# Patient Record
Sex: Female | Born: 1995 | Race: White | Hispanic: No | Marital: Single | Smoking: Former smoker
Health system: Southern US, Community
[De-identification: ages and names within clinical notes are randomized; demographics above are authoritative.]

## PROBLEM LIST (undated history)

## (undated) ENCOUNTER — Inpatient Hospital Stay (HOSPITAL_COMMUNITY): Payer: Self-pay

## (undated) DIAGNOSIS — J45909 Unspecified asthma, uncomplicated: Secondary | ICD-10-CM

## (undated) DIAGNOSIS — N200 Calculus of kidney: Secondary | ICD-10-CM

## (undated) HISTORY — PX: TONSILLECTOMY: SUR1361

## (undated) HISTORY — DX: Unspecified asthma, uncomplicated: J45.909

---
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
## 2016-01-01 ENCOUNTER — Encounter: Payer: Self-pay | Admitting: Obstetrics & Gynecology

## 2016-01-01 ENCOUNTER — Ambulatory Visit (INDEPENDENT_AMBULATORY_CARE_PROVIDER_SITE_OTHER): Payer: Medicaid Other | Admitting: Obstetrics & Gynecology

## 2016-01-01 VITALS — BP 98/60 | HR 74 | Wt 120.0 lb

## 2016-01-01 DIAGNOSIS — Z331 Pregnant state, incidental: Secondary | ICD-10-CM

## 2016-01-01 DIAGNOSIS — Z3402 Encounter for supervision of normal first pregnancy, second trimester: Secondary | ICD-10-CM

## 2016-01-01 DIAGNOSIS — Z1389 Encounter for screening for other disorder: Secondary | ICD-10-CM

## 2016-01-01 LAB — POCT URINALYSIS DIPSTICK
Glucose, UA: NEGATIVE
Ketones, UA: NEGATIVE
Leukocytes, UA: NEGATIVE
NITRITE UA: NEGATIVE
Protein, UA: NEGATIVE
RBC UA: NEGATIVE

## 2016-01-01 NOTE — Progress Notes (Signed)
G1P0 5234w2d Estimated Date of Delivery: 04/06/16  Blood pressure 98/60, pulse 74, weight 120 lb (54.4 kg), last menstrual period 07/01/2015.   BP weight and urine results all reviewed and noted.  Please refer to the obstetrical flow sheet for the fundal height and fetal heart rate documentation:  Patient reports good fetal movement, denies any bleeding and no rupture of membranes symptoms or regular contractions. Patient is without complaints. All questions were answered.  Orders Placed This Encounter  Procedures  . POCT urinalysis dipstick    Plan:  Continued routine obstetrical care, wet prep was negative, no yeast BV Normal wet prep  Return for keep appt next week for PN2.

## 2016-01-07 ENCOUNTER — Telehealth: Payer: Self-pay | Admitting: *Deleted

## 2016-01-07 ENCOUNTER — Encounter (HOSPITAL_COMMUNITY): Payer: Self-pay | Admitting: *Deleted

## 2016-01-07 ENCOUNTER — Inpatient Hospital Stay (HOSPITAL_COMMUNITY)
Admission: AD | Admit: 2016-01-07 | Discharge: 2016-01-07 | Disposition: A | Discharge status: Home / Self Care | Payer: Medicaid Other | Source: Ambulatory Visit | Attending: Family Medicine | Admitting: Family Medicine

## 2016-01-07 DIAGNOSIS — O4702 False labor before 37 completed weeks of gestation, second trimester: Secondary | ICD-10-CM | POA: Diagnosis not present

## 2016-01-07 DIAGNOSIS — Z87891 Personal history of nicotine dependence: Secondary | ICD-10-CM | POA: Diagnosis not present

## 2016-01-07 DIAGNOSIS — O368120 Decreased fetal movements, second trimester, not applicable or unspecified: Secondary | ICD-10-CM | POA: Diagnosis not present

## 2016-01-07 DIAGNOSIS — O479 False labor, unspecified: Secondary | ICD-10-CM

## 2016-01-07 DIAGNOSIS — O368190 Decreased fetal movements, unspecified trimester, not applicable or unspecified: Secondary | ICD-10-CM

## 2016-01-07 DIAGNOSIS — Z3A27 27 weeks gestation of pregnancy: Secondary | ICD-10-CM | POA: Insufficient documentation

## 2016-01-07 DIAGNOSIS — O4700 False labor before 37 completed weeks of gestation, unspecified trimester: Secondary | ICD-10-CM

## 2016-01-07 HISTORY — DX: Calculus of kidney: N20.0

## 2016-01-07 LAB — URINALYSIS, ROUTINE W REFLEX MICROSCOPIC
Bilirubin Urine: NEGATIVE
Glucose, UA: NEGATIVE mg/dL
Hgb urine dipstick: NEGATIVE
KETONES UR: NEGATIVE mg/dL
NITRITE: NEGATIVE
PROTEIN: NEGATIVE mg/dL
Specific Gravity, Urine: <1.005 — ABNORMAL LOW (ref 1.005–1.030)
pH: 7.5 (ref 5.0–8.0)

## 2016-01-07 LAB — URINE MICROSCOPIC-ADD ON
Bacteria, UA: NONE SEEN
RBC / HPF: NONE SEEN RBC/hpf (ref 0–5)

## 2016-01-07 LAB — FETAL FIBRONECTIN: FETAL FIBRONECTIN: NEGATIVE

## 2016-01-07 MED ORDER — NIFEDIPINE 10 MG PO CAPS: 10.0000 mg | ORAL_CAPSULE | Freq: Once | ORAL | Status: DC

## 2016-01-07 MED ORDER — NIFEDIPINE 10 MG PO CAPS
10.0000 mg | ORAL_CAPSULE | Freq: Once | ORAL | Status: AC
  Administered 2016-01-07: 10 mg via ORAL
  Filled 2016-01-07: qty 1

## 2016-01-07 NOTE — Telephone Encounter (Signed)
Pt c/o decreased FM, "feeling the baby move some but not as much." per Joellyn HaffKim Booker, CNM, pt to eat and drink something sweet, lay on side and see if she can feel the baby move "kick count" 10 x 2 hours, if she does not feel the baby move 10 x [facility]. Pt verbalized understanding.

## 2016-01-07 NOTE — MAU Note (Signed)
Not moving as much the last 2 days.  Called her dr, 6 kicks in 2 hrs, instructed to come in. Denies bleeding or leaking. No pain.

## 2016-01-07 NOTE — Discharge Instructions (Signed)
Braxton Hicks Contractions °Contractions of the uterus can occur throughout pregnancy. Contractions are not always a sign that you are in labor.  °WHAT ARE BRAXTON HICKS CONTRACTIONS?  °Contractions that occur before labor are called Braxton Hicks contractions, or false labor. Toward the end of pregnancy (32-34 weeks), these contractions can develop more often and may become more forceful. This is not true labor because these contractions do not result in opening (dilatation) and thinning of the cervix. They are sometimes difficult to tell apart from true labor because these contractions can be forceful and people have different pain tolerances. You should not feel embarrassed if you go to the hospital with false labor. Sometimes, the only way to tell if you are in true labor is for your health care provider to look for changes in the cervix. °If there are no prenatal problems or other health problems associated with the pregnancy, it is completely safe to be sent home with false labor and await the onset of true labor. °HOW CAN YOU TELL THE DIFFERENCE BETWEEN TRUE AND FALSE LABOR? °False Labor °· The contractions of false labor are usually shorter and not as hard as those of true labor.   °· The contractions are usually irregular.   °· The contractions are often felt in the front of the lower abdomen and in the groin.   °· The contractions may go away when you walk around or change positions while lying down.   °· The contractions get weaker and are shorter lasting as time goes on.   °· The contractions do not usually become progressively stronger, regular, and closer together as with true labor.   °True Labor °· Contractions in true labor last 30-70 seconds, become very regular, usually become more intense, and increase in frequency.   °· The contractions do not go away with walking.   °· The discomfort is usually felt in the top of the uterus and spreads to the lower abdomen and low back.   °· True labor can be  determined by your health care provider with an exam. This will show that the cervix is dilating and getting thinner.   °WHAT TO REMEMBER °· Keep up with your usual exercises and follow other instructions given by your health care provider.   °· Take medicines as directed by your health care provider.   °· Keep your regular prenatal appointments.   °· Eat and drink lightly if you think you are going into labor.   °· If Braxton Hicks contractions are making you uncomfortable:   °¨ Change your position from lying down or resting to walking, or from walking to resting.   °¨ Sit and rest in a tub of warm water.   °¨ Drink 2-3 glasses of water. Dehydration may cause these contractions.   °¨ Do slow and deep breathing several times an hour.   °WHEN SHOULD I SEEK IMMEDIATE MEDICAL CARE? °Seek immediate medical care if: °· Your contractions become stronger, more regular, and closer together.   °· You have fluid leaking or gushing from your vagina.   °· You have a fever.   °· You pass blood-tinged mucus.   °· You have vaginal bleeding.   °· You have continuous abdominal pain.   °· You have low back pain that you never had before.   °· You feel your baby's head pushing down and causing pelvic pressure.   °· Your baby is not moving as much as it used to.   °  °This information is not intended to replace advice given to you by your health care provider. Make sure you discuss any questions you have with your health care   provider.   Document Released: 03/03/2005 Document Revised: 03/08/2013 Document Reviewed: 12/13/2012 Elsevier Interactive Patient Education 2016 [employer] Inc[employer]  Fetal Fibronectin Fetal fibronectin (fFN) is a protein that your body produces during pregnancy. This protein is normally found in your vaginal fluid in early pregnancy and just before delivery. It should not be there between 22 and 35 weeks of pregnancy. Having fFN in your vagina between 22 and 35 weeks could be a warning sign that your baby will  be born early (prematurely). Babies born prematurely, or before 37 weeks, may have trouble breathing or feeding. A negative fFN test between 22 and 35 weeks means that it is unlikely you will have a premature delivery in the next 2 weeks. You may have this test if you have symptoms of premature labor. These include:  Contractions.  Increased vaginal discharge.  Backache. If there is a chance of preterm labor and delivery, your health care provider will monitor you carefully and take steps to delay your labor if necessary.  This test requires a sample of fluid from inside your vagina. Your health care provider collects this sample using a cotton swab.  PREPARATION FOR TEST   Ask your health care provider if:  You need to avoid using lubricants or douches before this exam.  You need to avoid sexual intercourse for 24 hours before the exam.  Tell your health care provider if you have a vaginal yeast infection or any symptoms of a yeast infection:  Itching.  Soreness.  Discharge. RESULTS It is your responsibility to obtain your test results. Ask the lab or department performing the test when and how you will get your results. Contact your health care provider to discuss any questions you have about your results.  The results of this test will be positive or negative.  Meaning of Negative Test Results A negative result means no fFN was found in your vaginal fluid. A negative result means that there is very little chance you will go into labor in the next two weeks. You may have this test again in two weeks if you are still having symptoms of early labor. Meaning of Positive Test Results A positive result means fFN was found in your vaginal fluid. A positive result does not mean you will go into early labor. It does mean your risk is greater. Your health care provider may do other tests and exams to closely follow your pregnancy.   This information is not intended to replace advice given  to you by your health care provider. Make sure you discuss any questions you have with your health care provider.   Document Released: 01/03/2004 Document Revised: 03/24/2014 Document Reviewed: 05/31/2013 Elsevier Interactive Patient Education 2016 [employer]. Second Trimester of Pregnancy The second trimester is from week 13 through week 28, months 4 through 6. The second trimester is often a time when you feel your best. Your body has also adjusted to being pregnant, and you begin to feel better physically. Usually, morning sickness has lessened or quit completely, you may have more energy, and you may have an increase in appetite. The second trimester is also a time when the fetus is growing rapidly. At the end of the sixth month, the fetus is about 9 inches long and weighs about 1 pounds. You will likely begin to feel the baby move (quickening) between 18 and 20 weeks of the pregnancy. BODY CHANGES Your body goes through many changes during pregnancy. The changes vary from woman to woman.  Your weight will continue to increase. You will notice your lower abdomen bulging out.  You may begin to get stretch marks on your hips, abdomen, and breasts.  You may develop headaches that can be relieved by medicines approved by your health care provider.  You may urinate more often because the fetus is pressing on your bladder.  You may develop or continue to have heartburn as a result of your pregnancy.  You may develop constipation because certain hormones are causing the muscles that push waste through your intestines to slow down.  You may develop hemorrhoids or swollen, bulging veins (varicose veins).  You may have back pain because of the weight gain and pregnancy hormones relaxing your joints between the bones in your pelvis and as a result of a shift in weight and the muscles that support your balance.  Your breasts will continue to grow and be tender.  Your gums may bleed and may be  sensitive to brushing and flossing.  Dark spots or blotches (chloasma, mask of pregnancy) may develop on your face. This will likely fade after the baby is born.  A dark line from your belly button to the pubic area (linea nigra) may appear. This will likely fade after the baby is born.  You may have changes in your hair. These can include thickening of your hair, rapid growth, and changes in texture. Some women also have hair loss during or after pregnancy, or hair that feels dry or thin. Your hair will most likely return to normal after your baby is born. WHAT TO EXPECT AT YOUR PRENATAL VISITS During a routine prenatal visit:  You will be weighed to make sure you and the fetus are growing normally.  Your blood pressure will be taken.  Your abdomen will be measured to track your baby's growth.  The fetal heartbeat will be listened to.  Any test results from the previous visit will be discussed. Your health care provider may ask you:  How you are feeling.  If you are feeling the baby move.  If you have had any abnormal symptoms, such as leaking fluid, bleeding, severe headaches, or abdominal cramping.  If you are using any tobacco products, including cigarettes, chewing tobacco, and electronic cigarettes.  If you have any questions. Other tests that may be performed during your second trimester include:  Blood tests that check for:  Low iron levels (anemia).  Gestational diabetes (between 24 and 28 weeks).  Rh antibodies.  Urine tests to check for infections, diabetes, or protein in the urine.  An ultrasound to confirm the proper growth and development of the baby.  An amniocentesis to check for possible genetic problems.  Fetal screens for spina bifida and Down syndrome.  HIV (human immunodeficiency virus) testing. Routine prenatal testing includes screening for HIV, unless you choose not to have this test. HOME CARE INSTRUCTIONS   Avoid all smoking, herbs,  alcohol, and unprescribed drugs. These chemicals affect the formation and growth of the baby.  Do not use any tobacco products, including cigarettes, chewing tobacco, and electronic cigarettes. If you need help quitting, ask your health care provider. You may receive counseling support and other resources to help you quit.  Follow your health care provider's instructions regarding medicine use. There are medicines that are either safe or unsafe to take during pregnancy.  Exercise only as directed by your health care provider. Experiencing uterine cramps is a good sign to stop exercising.  Continue to eat regular, healthy meals.  Wear a good support bra for breast tenderness.  Do not use hot tubs, steam rooms, or saunas.  Wear your seat belt at all times when driving.  Avoid raw meat, uncooked cheese, cat litter boxes, and soil used by cats. These carry germs that can cause birth defects in the baby.  Take your prenatal vitamins.  Take 1500-2000 mg of calcium daily starting at the 20th week of pregnancy until you deliver your baby.  Try taking a stool softener (if your health care provider approves) if you develop constipation. Eat more high-fiber foods, such as fresh vegetables or fruit and whole grains. Drink plenty of fluids to keep your urine clear or pale yellow.  Take warm sitz baths to soothe any pain or discomfort caused by hemorrhoids. Use hemorrhoid cream if your health care provider approves.  If you develop varicose veins, wear support hose. Elevate your feet for 15 minutes, 3-4 times a day. Limit salt in your diet.  Avoid heavy lifting, wear low heel shoes, and practice good posture.  Rest with your legs elevated if you have leg cramps or low back pain.  Visit your dentist if you have not gone yet during your pregnancy. Use a soft toothbrush to brush your teeth and be gentle when you floss.  A sexual relationship may be continued unless your health care provider directs  you otherwise.  Continue to go to all your prenatal visits as directed by your health care provider. SEEK MEDICAL CARE IF:   You have dizziness.  You have mild pelvic cramps, pelvic pressure, or nagging pain in the abdominal area.  You have persistent nausea, vomiting, or diarrhea.  You have a bad smelling vaginal discharge.  You have pain with urination. SEEK IMMEDIATE MEDICAL CARE IF:   You have a fever.  You are leaking fluid from your vagina.  You have spotting or bleeding from your vagina.  You have severe abdominal cramping or pain.  You have rapid weight gain or loss.  You have shortness of breath with chest pain.  You notice sudden or extreme swelling of your face, hands, ankles, feet, or legs.  You have not felt your baby move in over an hour.  You have severe headaches that do not go away with medicine.  You have vision changes.   This information is not intended to replace advice given to you by your health care provider. Make sure you discuss any questions you have with your health care provider.   Document Released: 02/25/2001 Document Revised: 03/24/2014 Document Reviewed: 05/04/2012 Elsevier Interactive Patient Education [employer]2016 Elsevier Inc.

## 2016-01-07 NOTE — MAU Provider Note (Signed)
Chief Complaint:  Decreased Fetal Movement   First Provider Initiated Contact with Patient 01/07/16 1815     HPI: Sara MelnickKayla F [name] is a 20 y.o. G1P0 at 8427w1dwho presents to maternity admissions reporting decreased fetal movement for the past 2 days. . She reports good fetal movement, denies LOF, vaginal bleeding, vaginal itching/burning, urinary symptoms, h/a, dizziness, n/v, diarrhea, constipation or fever/chills.  She denies headache, visual changes or RUQ abdominal pain.  Other  This is a new problem. The current episode started today. The problem occurs constantly. The problem has been unchanged. Pertinent negatives include no abdominal pain, chills, fever, nausea or vomiting. Nothing aggravates the symptoms. She has tried nothing for the symptoms.   RN Note: Not moving as much the last 2 days.  Called her dr, 6 kicks in 2 hrs, instructed to come in. Denies bleeding or leaking. No pain.  Past Medical History: Past Medical History:  Diagnosis Date  . Asthma   . Kidney stone    UTI    Past obstetric history: OB History  Gravida Para Term Preterm AB Living  1            SAB TAB Ectopic Multiple Live Births               # Outcome Date GA Lbr Len/2nd Weight Sex Delivery Anes PTL Lv  1 Current               Past Surgical History: Past Surgical History:  Procedure Laterality Date  . CYSTOSCOPY WITH URETEROSCOPY, STONE BASKETRY AND STENT PLACEMENT Right 10/06/2014   Procedure: CYSTOSCOPY WITH URETEROSCOPY, STONE BASKETRY AND STENT PLACEMENT, RETROGRADE;  Surgeon: Malen GauzePatrick L McKenzie, MD;  Location: [facility];  Service: Urology;  Laterality: Right;  . TONSILLECTOMY      Family History: Family History  Problem Relation Age of Onset  . Bipolar disorder Mother   . Heart murmur Mother   . Alcohol abuse Mother   . Cancer Mother     breast  . Hypertension Father   . Hypertension Paternal Grandfather   . Arthritis Neg Hx     Social History: Social History  Substance Use Topics   . Smoking status: Former Smoker    Quit date: 06/13/2015  . Smokeless tobacco: Never Used  . Alcohol use No    Allergies: No Known Allergies  Meds:  Prescriptions Prior to Admission  Medication Sig Dispense Refill Last Dose  . Prenatal Vit-Fe Fumarate-FA (PRENATAL MULTIVITAMIN) TABS tablet Take 1 tablet by mouth at bedtime.   01/06/2016 at Unknown time  . albuterol (PROVENTIL HFA;VENTOLIN HFA) 108 (90 BASE) MCG/ACT inhaler Inhale 1-2 puffs into the lungs every 6 (six) hours as needed for wheezing or shortness of breath.    Rescue    I have reviewed patient's Past Medical Hx, Surgical Hx, Family Hx, Social Hx, medications and allergies.   ROS:  Review of Systems  Constitutional: Negative for chills and fever.  Gastrointestinal: Negative for abdominal pain, nausea and vomiting.  Genitourinary: Negative for dysuria and pelvic pain.   Other systems negative  Physical Exam  Patient Vitals for the past 24 hrs:  BP Temp Temp src Pulse Resp  01/07/16 1808 132/75 98.2 F (36.8 C) Oral 90 16   Constitutional: Well-developed, well-nourished female in no acute distress.  Cardiovascular: normal rate and rhythm Respiratory: normal effort, clear to auscultation bilaterally GI: Abd soft, non-tender, gravid appropriate for gestational age.   No rebound or guarding. MS: Extremities nontender, no edema, normal  ROM Neurologic: Alert and oriented x 4.  GU: Neg CVAT.  FHT:  Baseline 135 , moderate variability, accelerations present, no decelerations Contractions: Uterine irritability   Cervix long and closed  Labs: A/Positive/-- (07/12 1630) Results for orders placed or performed during the hospital encounter of 01/07/16 (from the past 72 hour(s))  Urinalysis, Routine w reflex microscopic (not at [facility])     Status: Abnormal   Collection Time: 01/07/16  6:00 PM  Result Value Ref Range   Color, Urine YELLOW YELLOW   APPearance CLEAR CLEAR   Specific Gravity, Urine <1.005 (L) 1.005 - 1.030    pH 7.5 5.0 - 8.0   Glucose, UA NEGATIVE NEGATIVE mg/dL   Hgb urine dipstick NEGATIVE NEGATIVE   Bilirubin Urine NEGATIVE NEGATIVE   Ketones, ur NEGATIVE NEGATIVE mg/dL   Protein, ur NEGATIVE NEGATIVE mg/dL   Nitrite NEGATIVE NEGATIVE   Leukocytes, UA SMALL (A) NEGATIVE  Urine microscopic-add on     Status: Abnormal   Collection Time: 01/07/16  6:00 PM  Result Value Ref Range   Squamous Epithelial / LPF 0-5 (A) NONE SEEN   WBC, UA 0-5 0 - 5 WBC/hpf   RBC / HPF NONE SEEN 0 - 5 RBC/hpf   Bacteria, UA NONE SEEN NONE SEEN  Fetal fibronectin     Status: None   Collection Time: 01/07/16  6:50 PM  Result Value Ref Range   Fetal Fibronectin NEGATIVE NEGATIVE    Imaging:  No results found.  MAU Course/MDM: I have ordered labs and reviewed results.  NST reviewed Consult Dr Adrian Blackwater with presentation, exam findings and test results.  Treatments in MAU included Fetal Fibronectin and Procardia. Pushed fluids and gave procardia FFn was negative. UCs/irritability slowed after Procardia..    Assessment: 1. Encounter for supervision of normal first pregnancy in second trimester   2.     Decreased fetal movement 3.     Preterm uterine irritability with negative fetal fibronectin and no cervical change 4.       Reactive fetal heart rate pattern  Plan: Discharge home Preterm Labor precautions and fetal kick counts Follow up in Office for prenatal visits and recheck  Encouraged to return here or to other Urgent Care/ED if she develops worsening of symptoms, increase in pain, fever, or other concerning symptoms.   Pt stable at time of discharge.  Wynelle Bourgeois CNM, MSN Certified Nurse-Midwife 01/07/2016 7:12 PM

## 2016-01-10 ENCOUNTER — Encounter: Payer: Self-pay | Admitting: Women's Health

## 2016-01-10 ENCOUNTER — Other Ambulatory Visit: Payer: Medicaid Other

## 2016-01-10 ENCOUNTER — Ambulatory Visit (INDEPENDENT_AMBULATORY_CARE_PROVIDER_SITE_OTHER): Payer: Medicaid Other | Admitting: Women's Health

## 2016-01-10 VITALS — BP 110/64 | HR 104 | Wt 126.0 lb

## 2016-01-10 DIAGNOSIS — Z3403 Encounter for supervision of normal first pregnancy, third trimester: Secondary | ICD-10-CM

## 2016-01-10 DIAGNOSIS — Z3483 Encounter for supervision of other normal pregnancy, third trimester: Secondary | ICD-10-CM

## 2016-01-10 DIAGNOSIS — Z3A28 28 weeks gestation of pregnancy: Secondary | ICD-10-CM

## 2016-01-10 DIAGNOSIS — Z331 Pregnant state, incidental: Secondary | ICD-10-CM

## 2016-01-10 DIAGNOSIS — Z3402 Encounter for supervision of normal first pregnancy, second trimester: Secondary | ICD-10-CM

## 2016-01-10 DIAGNOSIS — Z1389 Encounter for screening for other disorder: Secondary | ICD-10-CM

## 2016-01-10 NOTE — Patient Instructions (Signed)

## 2016-01-10 NOTE — Progress Notes (Addendum)
Low-risk OB appointment G1P0 4532w4d Estimated Date of Delivery: 04/06/16 BP 110/64   Pulse (!) 104   Wt 126 lb (57.2 kg)   LMP 07/01/2015 (Exact Date)   BMI 23.05 kg/m   BP, weight, and urine reviewed.  Refer to obstetrical flow sheet for FH & FHR.  Reports  fm.  Denies regular uc's, lof, vb, or uti s/s. Has had belly ring x 5147yr, has always had 'fluid', read she shouldn't take it out during pregnancy until talking to doctor. OK to go ahead and remove if wants, does not appear infected. Discussed s/s infection to report. [facility], reactive NST, states she doesn't feel like baby moves as much as he should. Reviewed u/s- does have anterior placenta- discussed this can block some of the subtle movements- reviewed fkc-reasons to seek care. Was given procardia for uterine irritability w/ neg ffn and cl/th/high cx.  Reviewed ptl s/s, fkc. Recommended Tdap at HD/PCP per CDC guidelines.  Plan:  Continue routine obstetrical care  F/U in 3wks for OB appointment  PN2 today

## 2016-01-11 LAB — GLUCOSE TOLERANCE, 2 HOURS W/ 1HR
GLUCOSE, FASTING: 90 mg/dL (ref 65–91)
Glucose, 1 hour: 143 mg/dL (ref 65–179)
Glucose, 2 hour: 125 mg/dL (ref 65–152)

## 2016-01-11 LAB — CBC
HEMATOCRIT: 39.7 % (ref 34.0–46.6)
Hemoglobin: 13.7 g/dL (ref 11.1–15.9)
MCH: 33.3 pg — AB (ref 26.6–33.0)
MCHC: 34.5 g/dL (ref 31.5–35.7)
MCV: 96 fL (ref 79–97)
Platelets: 209 10*3/uL (ref 150–379)
RBC: 4.12 x10E6/uL (ref 3.77–5.28)
RDW: 13 % (ref 12.3–15.4)
WBC: 10.6 10*3/uL (ref 3.4–10.8)

## 2016-01-11 LAB — RPR: RPR: NONREACTIVE

## 2016-01-11 LAB — ANTIBODY SCREEN: Antibody Screen: NEGATIVE

## 2016-01-11 LAB — HIV ANTIBODY (ROUTINE TESTING W REFLEX): HIV Screen 4th Generation wRfx: NONREACTIVE

## 2016-01-21 ENCOUNTER — Telehealth: Payer: Self-pay | Admitting: *Deleted

## 2016-01-21 NOTE — Telephone Encounter (Signed)
Pt c/o swelling in vaginal, pain with intercourse, increase discharge white to clear,+FM, no vaginal bleeding, no gush of fluids. Per Cyril MourningJennifer Griffin, NP pt needs an appt for today or tomorrow. Pt given an appt for tomorrow with Cathie BeamsFran Cresenzo-Dishmon, CNM.

## 2016-01-22 ENCOUNTER — Encounter: Payer: Self-pay | Admitting: Advanced Practice Midwife

## 2016-01-22 ENCOUNTER — Ambulatory Visit (INDEPENDENT_AMBULATORY_CARE_PROVIDER_SITE_OTHER): Payer: Medicaid Other | Admitting: Advanced Practice Midwife

## 2016-01-22 VITALS — BP 102/58 | HR 88 | Wt 128.0 lb

## 2016-01-22 DIAGNOSIS — O368130 Decreased fetal movements, third trimester, not applicable or unspecified: Secondary | ICD-10-CM | POA: Diagnosis not present

## 2016-01-22 DIAGNOSIS — Z3A30 30 weeks gestation of pregnancy: Secondary | ICD-10-CM

## 2016-01-22 DIAGNOSIS — N760 Acute vaginitis: Secondary | ICD-10-CM

## 2016-01-22 DIAGNOSIS — Z331 Pregnant state, incidental: Secondary | ICD-10-CM | POA: Diagnosis not present

## 2016-01-22 DIAGNOSIS — Z1389 Encounter for screening for other disorder: Secondary | ICD-10-CM

## 2016-01-22 DIAGNOSIS — B9689 Other specified bacterial agents as the cause of diseases classified elsewhere: Secondary | ICD-10-CM | POA: Diagnosis not present

## 2016-01-22 DIAGNOSIS — Z3403 Encounter for supervision of normal first pregnancy, third trimester: Secondary | ICD-10-CM

## 2016-01-22 LAB — POCT URINALYSIS DIPSTICK
GLUCOSE UA: NEGATIVE
Glucose, UA: NEGATIVE
KETONES UA: NEGATIVE
Nitrite, UA: NEGATIVE
PROTEIN UA: NEGATIVE
PROTEIN UA: NEGATIVE
RBC UA: NEGATIVE

## 2016-01-22 MED ORDER — METRONIDAZOLE 500 MG PO TABS: 500.0000 mg | ORAL_TABLET | Freq: Two times a day (BID) | ORAL | 0 refills | Status: DC

## 2016-01-22 NOTE — Progress Notes (Signed)
WORK-IN FOR DECREASED fm AND SWOLLEN VAGINA, DYSPAREUNIA, WHITE DC  G1P0 775w2d Estimated Date of Delivery: 04/06/16  Blood pressure (!) 102/58, pulse 88, weight 128 lb (58.1 kg), last menstrual period 07/01/2015.   BP weight and urine results all reviewed and noted.  Please refer to the obstetrical flow sheet for the fundal height and fetal heart rate documentation:  Patient reports good fetal movement, denies any bleeding and no rupture of membranes symptoms or regular contractions.  PELVIC EXAM:  dedematous labia minora, thin whte dc w/amine odor, wet prep + clue, no yeast All questions were answered.  Orders Placed This Encounter  Procedures  . POCT urinalysis dipstick    Plan:  Continued routine obstetrical care, Rx metrogel, cool compresses NST very reactive  Return for As scheduled.

## 2016-01-31 ENCOUNTER — Ambulatory Visit (INDEPENDENT_AMBULATORY_CARE_PROVIDER_SITE_OTHER): Payer: Medicaid Other | Admitting: Advanced Practice Midwife

## 2016-01-31 ENCOUNTER — Encounter: Payer: Self-pay | Admitting: Advanced Practice Midwife

## 2016-01-31 VITALS — BP 120/70 | HR 98 | Wt 130.0 lb

## 2016-01-31 DIAGNOSIS — O368130 Decreased fetal movements, third trimester, not applicable or unspecified: Secondary | ICD-10-CM

## 2016-01-31 DIAGNOSIS — O26843 Uterine size-date discrepancy, third trimester: Secondary | ICD-10-CM

## 2016-01-31 DIAGNOSIS — Z1389 Encounter for screening for other disorder: Secondary | ICD-10-CM

## 2016-01-31 DIAGNOSIS — Z331 Pregnant state, incidental: Secondary | ICD-10-CM

## 2016-01-31 DIAGNOSIS — Z3403 Encounter for supervision of normal first pregnancy, third trimester: Secondary | ICD-10-CM

## 2016-01-31 DIAGNOSIS — Z3A31 31 weeks gestation of pregnancy: Secondary | ICD-10-CM

## 2016-01-31 LAB — POCT URINALYSIS DIPSTICK
Blood, UA: NEGATIVE
Glucose, UA: NEGATIVE
Ketones, UA: NEGATIVE
NITRITE UA: NEGATIVE
PROTEIN UA: NEGATIVE

## 2016-01-31 NOTE — Progress Notes (Signed)
G1P0 2674w4d Estimated Date of Delivery: 04/06/16  Blood pressure 120/70, pulse 98, weight 130 lb (59 kg), last menstrual period 07/01/2015.   BP weight and urine results all reviewed and noted.  Please refer to the obstetrical flow sheet for the fundal height and fetal heart rate documentation:  Patient reports decreaed fetal movement, but "about the same as last time." Upon further discussion , pt definitely gets >5 kicks in an hours Denies any bleeding and no rupture of membranes symptoms or regular contractions. Patient is without complaints. Vaginal complaints are much better.  All questions were answered.  Orders Placed This Encounter  Procedures  . US OB Follow Up  . POCT urinalysis dipstick   NST reactive ; kick counts discussed Plan:  Continued routine obstetrical care,   Return in about 2 weeks (around 02/14/2016) for LROB.

## 2016-02-10 ENCOUNTER — Encounter (HOSPITAL_COMMUNITY): Payer: Self-pay | Admitting: Certified Nurse Midwife

## 2016-02-10 ENCOUNTER — Inpatient Hospital Stay (HOSPITAL_COMMUNITY)
Admission: AD | Admit: 2016-02-10 | Discharge: 2016-02-10 | Disposition: A | Discharge status: Home / Self Care | Payer: Medicaid Other | Source: Ambulatory Visit | Attending: Obstetrics & Gynecology | Admitting: Obstetrics & Gynecology

## 2016-02-10 DIAGNOSIS — Z803 Family history of malignant neoplasm of breast: Secondary | ICD-10-CM | POA: Insufficient documentation

## 2016-02-10 DIAGNOSIS — Z87891 Personal history of nicotine dependence: Secondary | ICD-10-CM | POA: Insufficient documentation

## 2016-02-10 DIAGNOSIS — Z87440 Personal history of urinary (tract) infections: Secondary | ICD-10-CM | POA: Diagnosis not present

## 2016-02-10 DIAGNOSIS — Z8249 Family history of ischemic heart disease and other diseases of the circulatory system: Secondary | ICD-10-CM | POA: Diagnosis not present

## 2016-02-10 DIAGNOSIS — O4703 False labor before 37 completed weeks of gestation, third trimester: Secondary | ICD-10-CM | POA: Diagnosis not present

## 2016-02-10 DIAGNOSIS — Z3A32 32 weeks gestation of pregnancy: Secondary | ICD-10-CM | POA: Diagnosis not present

## 2016-02-10 DIAGNOSIS — Z87442 Personal history of urinary calculi: Secondary | ICD-10-CM | POA: Insufficient documentation

## 2016-02-10 DIAGNOSIS — Z3403 Encounter for supervision of normal first pregnancy, third trimester: Secondary | ICD-10-CM

## 2016-02-10 DIAGNOSIS — J45909 Unspecified asthma, uncomplicated: Secondary | ICD-10-CM | POA: Diagnosis not present

## 2016-02-10 DIAGNOSIS — O26893 Other specified pregnancy related conditions, third trimester: Secondary | ICD-10-CM

## 2016-02-10 DIAGNOSIS — N898 Other specified noninflammatory disorders of vagina: Secondary | ICD-10-CM | POA: Diagnosis not present

## 2016-02-10 LAB — URINE MICROSCOPIC-ADD ON

## 2016-02-10 LAB — URINALYSIS, ROUTINE W REFLEX MICROSCOPIC
BILIRUBIN URINE: NEGATIVE
Glucose, UA: NEGATIVE mg/dL
HGB URINE DIPSTICK: NEGATIVE
KETONES UR: NEGATIVE mg/dL
NITRITE: NEGATIVE
Protein, ur: NEGATIVE mg/dL
pH: 6.5 (ref 5.0–8.0)

## 2016-02-10 LAB — WET PREP, GENITAL
Clue Cells Wet Prep HPF POC: NONE SEEN
SPERM: NONE SEEN
Trich, Wet Prep: NONE SEEN
YEAST WET PREP: NONE SEEN

## 2016-02-10 LAB — AMNISURE RUPTURE OF MEMBRANE (ROM) NOT AT ARMC: Amnisure ROM: NEGATIVE

## 2016-02-10 NOTE — MAU Note (Signed)
Pt reports watery discharge since 2300 last pm. Reports some cramping.

## 2016-02-10 NOTE — Discharge Instructions (Signed)
. °Preterm Labor and Birth Information °The normal length of a pregnancy is 39-41 weeks. Preterm labor is when labor starts before 37 completed weeks of pregnancy. °What are the risk factors for preterm labor? °Preterm labor is more likely to occur in women who: °· Have certain infections during pregnancy such as a bladder infection, sexually transmitted infection, or infection inside the uterus (chorioamnionitis). °· Have a shorter-than-normal cervix. °· Have gone into preterm labor before. °· Have had surgery on their cervix. °· Are younger than age 17 or older than age 35. °· Are African American. °· Are pregnant with twins or multiple babies (multiple gestation). °· Take street drugs or smoke while pregnant. °· Do not gain enough weight while pregnant. °· Became pregnant shortly after having been pregnant. °What are the symptoms of preterm labor? °Symptoms of preterm labor include: °· Cramps similar to those that can happen during a menstrual period. The cramps may happen with diarrhea. °· Pain in the abdomen or lower back. °· Regular uterine contractions that may feel like tightening of the abdomen. °· A feeling of increased pressure in the pelvis. °· Increased watery or bloody mucus discharge from the vagina. °· Water breaking (ruptured amniotic sac). °Why is it important to recognize signs of preterm labor? °It is important to recognize signs of preterm labor because babies who are born prematurely may not be fully developed. This can put them at an increased risk for: °· Long-term (chronic) heart and lung problems. °· Difficulty immediately after birth with regulating body systems, including blood sugar, body temperature, heart rate, and breathing rate. °· Bleeding in the brain. °· Cerebral palsy. °· Learning difficulties. °· Death. °These risks are highest for babies who are born before 34 weeks of pregnancy. °How is preterm labor treated? °Treatment depends on the length of your pregnancy, your condition,  and the health of your baby. It may involve: °· Having a stitch (suture) placed in your cervix to prevent your cervix from opening too early (cerclage). °· Taking or being given medicines, such as: °¨ Hormone medicines. These may be given early in pregnancy to help support the pregnancy. °¨ Medicine to stop contractions. °¨ Medicines to help mature the baby’s lungs. These may be prescribed if the risk of delivery is high. °¨ Medicines to prevent your baby from developing cerebral palsy. °If the labor happens before 34 weeks of pregnancy, you may need to stay in the hospital. °What should I do if I think I am in preterm labor? °If you think that you are going into preterm labor, call your health care provider right away. °How can I prevent preterm labor in future pregnancies? °To increase your chance of having a full-term pregnancy: °· Do not use any tobacco products, such as cigarettes, chewing tobacco, and e-cigarettes. If you need help quitting, ask your health care provider. °· Do not use street drugs or medicines that have not been prescribed to you during your pregnancy. °· Talk with your health care provider before taking any herbal supplements, even if you have been taking them regularly. °· Make sure you gain a healthy amount of weight during your pregnancy. °· Watch for infection. If you think that you might have an infection, get it checked right away. °· Make sure to tell your health care provider if you have gone into preterm labor before. °This information is not intended to replace advice given to you by your health care provider. Make sure you discuss any questions you have with your   health care provider. °Document Released: 05/24/2003 Document Revised: 08/14/2015 Document Reviewed: 07/25/2015 °Elsevier Interactive Patient Education © 2017 Elsevier Inc. ° °

## 2016-02-10 NOTE — MAU Provider Note (Signed)
Chief Complaint:  No chief complaint on file.   None     HPI: Sara [name] is a 20 y.o. G1P0 at 6469w0d who presents to maternity admissions reporting leakage of clear fluid, enough to wet her underwear last night, with slight leakage off and on today requiring a panty liner but not a pad.  She reports occasional mild cramps, but nothing regular or painful.  She has not tried any treatments for her symptoms and they are intermittent and unchanged since onset. She reports good fetal movement, denies vaginal bleeding, vaginal itching/burning, urinary symptoms, h/a, dizziness, n/v, or fever/chills.    HPI  Past Medical History: Past Medical History:  Diagnosis Date  . Asthma   . Kidney stone    UTI    Past obstetric history: OB History  Gravida Para Term Preterm AB Living  1            SAB TAB Ectopic Multiple Live Births               # Outcome Date GA Lbr Len/2nd Weight Sex Delivery Anes PTL Lv  1 Current               Past Surgical History: Past Surgical History:  Procedure Laterality Date  . CYSTOSCOPY WITH URETEROSCOPY, STONE BASKETRY AND STENT PLACEMENT Right 10/06/2014   Procedure: CYSTOSCOPY WITH URETEROSCOPY, STONE BASKETRY AND STENT PLACEMENT, RETROGRADE;  Surgeon: Malen GauzePatrick L McKenzie, MD;  Location: [facility];  Service: Urology;  Laterality: Right;  . TONSILLECTOMY      Family History: Family History  Problem Relation Age of Onset  . Bipolar disorder Mother   . Heart murmur Mother   . Alcohol abuse Mother   . Cancer Mother     breast  . Hypertension Father   . Hypertension Paternal Grandfather   . Arthritis Neg Hx     Social History: Social History  Substance Use Topics  . Smoking status: Former Smoker    Quit date: 06/13/2015  . Smokeless tobacco: Never Used  . Alcohol use No    Allergies: No Known Allergies  Meds:  No prescriptions prior to admission.    ROS:  Review of Systems  Constitutional: Negative for chills, fatigue and fever.  Eyes:  Negative for visual disturbance.  Respiratory: Negative for shortness of breath.   Cardiovascular: Negative for chest pain.  Gastrointestinal: Negative for abdominal pain, nausea and vomiting.  Genitourinary: Positive for vaginal discharge. Negative for difficulty urinating, dysuria, flank pain, pelvic pain, vaginal bleeding and vaginal pain.  Neurological: Negative for dizziness and headaches.  Psychiatric/Behavioral: Negative.      I have reviewed patient's Past Medical Hx, Surgical Hx, Family Hx, Social Hx, medications and allergies.   Physical Exam   Patient Vitals for the past 24 hrs:  BP Temp Temp src Pulse Resp SpO2 Height Weight  02/10/16 2208 126/83 98 F (36.7 C) Oral 98 16 - - -  02/10/16 2048 135/78 98.4 F (36.9 C) Oral 88 17 100 % 5\' 2"  (1.575 m) 134 lb (60.8 kg)   Constitutional: Well-developed, well-nourished female in no acute distress.  Cardiovascular: normal rate Respiratory: normal effort GI: Abd soft, non-tender, gravid appropriate for gestational age.  MS: Extremities nontender, no edema, normal ROM Neurologic: Alert and oriented x 4.  GU: Neg CVAT.  PELVIC EXAM: Wet prep and amnisure collected by blind swab by RN    FHT:  Baseline 125 , moderate variability, accelerations present, no decelerations Contractions: None on toco or to  palpation   Labs: Results for orders placed or performed during the hospital encounter of 02/10/16 (from the past 24 hour(s))  Urinalysis, Routine w reflex microscopic (not at [facility])     Status: Abnormal   Collection Time: 02/10/16  8:53 PM  Result Value Ref Range   Color, Urine YELLOW YELLOW   APPearance CLEAR CLEAR   Specific Gravity, Urine <1.005 (L) 1.005 - 1.030   pH 6.5 5.0 - 8.0   Glucose, UA NEGATIVE NEGATIVE mg/dL   Hgb urine dipstick NEGATIVE NEGATIVE   Bilirubin Urine NEGATIVE NEGATIVE   Ketones, ur NEGATIVE NEGATIVE mg/dL   Protein, ur NEGATIVE NEGATIVE mg/dL   Nitrite NEGATIVE NEGATIVE   Leukocytes, UA  TRACE (A) NEGATIVE  Urine microscopic-add on     Status: Abnormal   Collection Time: 02/10/16  8:53 PM  Result Value Ref Range   Squamous Epithelial / LPF 0-5 (A) NONE SEEN   WBC, UA 0-5 0 - 5 WBC/hpf   RBC / HPF 0-5 0 - 5 RBC/hpf   Bacteria, UA FEW (A) NONE SEEN  Wet prep, genital     Status: Abnormal   Collection Time: 02/10/16  9:21 PM  Result Value Ref Range   Yeast Wet Prep HPF POC NONE SEEN NONE SEEN   Trich, Wet Prep NONE SEEN NONE SEEN   Clue Cells Wet Prep HPF POC NONE SEEN NONE SEEN   WBC, Wet Prep HPF POC MODERATE (A) NONE SEEN   Sperm NONE SEEN   Amnisure rupture of membrane (rom)not [facility]     Status: None   Collection Time: 02/10/16  9:24 PM  Result Value Ref Range   Amnisure ROM NEGATIVE    A/Positive/-- (07/12 1630)  Imaging:  No results found.  MAU Course/MDM: I have ordered labs and reviewed results.  NST reviewed No evidence of preterm premature rupture of membranes or of preterm labor today Pt stable at time of discharge.  Today's evaluation included a work-up for preterm labor which can be life-threatening for both mom and baby.  Assessment: 1. Threatened preterm labor, third trimester   2. Encounter for supervision of normal first pregnancy in third trimester   3. Vaginal discharge during pregnancy in third trimester     Plan: Discharge home Labor precautions and fetal kick counts  Follow-up Information    [facility] Follow up.   Why:  As scheduled on Thursday.  Return to MAU as needed for signs of labor or emergencies. Contact information: [address] [place] [phone]           Medication List    TAKE these medications   albuterol 108 (90 Base) MCG/ACT inhaler Commonly known as:  PROVENTIL HFA;VENTOLIN HFA Inhale 1-2 puffs into the lungs every 6 (six) hours as needed for wheezing or shortness of breath.   metroNIDAZOLE 500 MG tablet Commonly known as:  FLAGYL Take 1 tablet (500 mg  total) by mouth 2 (two) times daily.   prenatal multivitamin Tabs tablet Take 1 tablet by mouth at bedtime.       Sharen CounterLisa Leftwich-Kirby Certified Nurse-Midwife 02/10/2016 10:15 PM

## 2016-02-14 ENCOUNTER — Ambulatory Visit (INDEPENDENT_AMBULATORY_CARE_PROVIDER_SITE_OTHER): Payer: Medicaid Other

## 2016-02-14 ENCOUNTER — Other Ambulatory Visit: Payer: Self-pay | Admitting: Advanced Practice Midwife

## 2016-02-14 ENCOUNTER — Ambulatory Visit (INDEPENDENT_AMBULATORY_CARE_PROVIDER_SITE_OTHER): Payer: Medicaid Other | Admitting: Obstetrics and Gynecology

## 2016-02-14 VITALS — BP 120/72 | HR 116 | Wt 132.4 lb

## 2016-02-14 DIAGNOSIS — O26843 Uterine size-date discrepancy, third trimester: Secondary | ICD-10-CM

## 2016-02-14 DIAGNOSIS — Z3403 Encounter for supervision of normal first pregnancy, third trimester: Secondary | ICD-10-CM

## 2016-02-14 DIAGNOSIS — Z3A33 33 weeks gestation of pregnancy: Secondary | ICD-10-CM

## 2016-02-14 DIAGNOSIS — Z3402 Encounter for supervision of normal first pregnancy, second trimester: Secondary | ICD-10-CM

## 2016-02-14 DIAGNOSIS — Z1389 Encounter for screening for other disorder: Secondary | ICD-10-CM

## 2016-02-14 DIAGNOSIS — Z331 Pregnant state, incidental: Secondary | ICD-10-CM

## 2016-02-14 LAB — POCT URINALYSIS DIPSTICK
GLUCOSE UA: NEGATIVE
KETONES UA: NEGATIVE
Leukocytes, UA: NEGATIVE
NITRITE UA: NEGATIVE
Protein, UA: NEGATIVE
RBC UA: NEGATIVE

## 2016-02-14 NOTE — Progress Notes (Addendum)
US 32+4 wks,cephalic,ant pl gr 2,fhr 136 bpm,normal ov's bilat,afi 10.5 cm,EFW 1782 g 29.5% Williams,14.6% Hadlock 14.6 %,AC 4.9%,RI .65,.69,S/D 3.04

## 2016-02-14 NOTE — Patient Instructions (Signed)
(  [phone] is the phone number for Pregnancy Classes or hospital tours at [facility].   You will be referred to  [email or web address] for more information on childbirth classes  At this site you may register for classes. You may sign up for a waiting list if classes are full. Please SIGN UP FOR THIS!.   When the waiting list becomes long, sometimes new classes can be added.  [email or web address] Please check out [email or web address]   for more information on childbirth classes

## 2016-02-14 NOTE — Progress Notes (Signed)
G1P0  Estimated Date of Delivery: 04/06/16 LROB 134w4d  Blood pressure 120/72, pulse (!) 116, weight 132 lb 6.4 oz (60.1 kg), last menstrual period 07/01/2015.   Urine results:negative  Chief Complaint  Patient presents with  . Routine Prenatal Visit    ultrasound today    Patient complaints: none at this time. Patient reports   good fetal movement, states she is doing kick counts                           denies any bleeding , rupture of membranes,or regular contractions.   refer to the ob flow sheet for FH and FHR, ,                          Physical Examination: General appearance - alert, well appearing, and in no distress                                      Abdomen - FH 33 ,                                                         -FHR 136                                                         soft, nontender, nondistended, no masses or organomegaly                                      Pelvic - examination not indicated                                            Questions were answered. Assessment: LROB G1P0 @ 4434w4d Estimated Date of Delivery: 04/06/16  Advised to attend childbirth classes and take hospital tour Given contact numbers to hospital education.  Plan:  Continued routine obstetrical care  F/u in 2 weeks for LROB care  By signing my name below, I, Sonum Patel, attest that this documentation has been prepared under the direction and in the presence of Tilda BurrowJohn V Davione Lenker, MD. Electronically Signed: Sonum Patel, Neurosurgeoncribe. 02/14/16. 2:21 PM.  I personally performed the services described in this documentation, which was SCRIBED in my presence. The recorded information has been reviewed and considered accurate. It has been edited as necessary during review. Tilda BurrowFERGUSON,Enoch Moffa V, MD

## 2016-02-28 ENCOUNTER — Encounter: Payer: Self-pay | Admitting: Advanced Practice Midwife

## 2016-02-28 ENCOUNTER — Ambulatory Visit (INDEPENDENT_AMBULATORY_CARE_PROVIDER_SITE_OTHER): Payer: Medicaid Other | Admitting: Advanced Practice Midwife

## 2016-02-28 VITALS — BP 122/80 | HR 100 | Wt 140.0 lb

## 2016-02-28 DIAGNOSIS — Z3403 Encounter for supervision of normal first pregnancy, third trimester: Secondary | ICD-10-CM

## 2016-02-28 DIAGNOSIS — Z1389 Encounter for screening for other disorder: Secondary | ICD-10-CM

## 2016-02-28 DIAGNOSIS — Z3A35 35 weeks gestation of pregnancy: Secondary | ICD-10-CM

## 2016-02-28 DIAGNOSIS — Z331 Pregnant state, incidental: Secondary | ICD-10-CM

## 2016-02-28 LAB — POCT URINALYSIS DIPSTICK
Blood, UA: NEGATIVE
Glucose, UA: NEGATIVE
KETONES UA: NEGATIVE
LEUKOCYTES UA: NEGATIVE
NITRITE UA: NEGATIVE
PROTEIN UA: NEGATIVE

## 2016-02-28 NOTE — Progress Notes (Signed)
G1P0 4331w4d Estimated Date of Delivery: 04/06/16  Blood pressure 122/80, pulse 100, weight 140 lb (63.5 kg), last menstrual period 07/01/2015.   BP weight and urine results all reviewed and noted.  Please refer to the obstetrical flow sheet for the fundal height and fetal heart rate documentation:  Patient reports good fetal movement, denies any bleeding and no rupture of membranes symptoms or regular contractions. Patient is without complaints. All questions were answered.  Orders Placed This Encounter  Procedures  . POCT urinalysis dipstick    Plan:  Continued routine obstetrical care,   Return in about 2 weeks (around 03/13/2016) for LROB.

## 2016-03-06 ENCOUNTER — Encounter: Payer: Self-pay | Admitting: Advanced Practice Midwife

## 2016-03-13 ENCOUNTER — Encounter: Payer: Self-pay | Admitting: Advanced Practice Midwife

## 2016-03-13 ENCOUNTER — Ambulatory Visit (INDEPENDENT_AMBULATORY_CARE_PROVIDER_SITE_OTHER): Payer: Medicaid Other | Admitting: Advanced Practice Midwife

## 2016-03-13 VITALS — BP 124/84 | HR 106 | Wt 144.8 lb

## 2016-03-13 DIAGNOSIS — Z1389 Encounter for screening for other disorder: Secondary | ICD-10-CM

## 2016-03-13 DIAGNOSIS — O26843 Uterine size-date discrepancy, third trimester: Secondary | ICD-10-CM

## 2016-03-13 DIAGNOSIS — Z3483 Encounter for supervision of other normal pregnancy, third trimester: Secondary | ICD-10-CM

## 2016-03-13 DIAGNOSIS — Z3685 Encounter for antenatal screening for Streptococcus B: Secondary | ICD-10-CM

## 2016-03-13 DIAGNOSIS — O26849 Uterine size-date discrepancy, unspecified trimester: Secondary | ICD-10-CM | POA: Insufficient documentation

## 2016-03-13 DIAGNOSIS — Z331 Pregnant state, incidental: Secondary | ICD-10-CM

## 2016-03-13 DIAGNOSIS — Z3A37 37 weeks gestation of pregnancy: Secondary | ICD-10-CM

## 2016-03-13 LAB — POCT URINALYSIS DIPSTICK
GLUCOSE UA: NEGATIVE
Ketones, UA: NEGATIVE
Leukocytes, UA: NEGATIVE
NITRITE UA: NEGATIVE
Protein, UA: NEGATIVE
RBC UA: NEGATIVE

## 2016-03-13 LAB — OB RESULTS CONSOLE GBS: GBS: NEGATIVE

## 2016-03-13 NOTE — Progress Notes (Signed)
G1P0 5341w4d Estimated Date of Delivery: 04/06/16  Blood pressure 124/84, pulse (!) 106, weight 144 lb 12.8 oz (65.7 kg), last menstrual period 07/01/2015.   BP weight and urine results all reviewed and noted.  Please refer to the obstetrical flow sheet for the fundal height and fetal heart rate documentation:  Patient reports good fetal movement, denies any bleeding and no rupture of membranes symptoms or regular contractions. Patient has had a greenish/watery dc for a few days.  No itch/irritation.  SSE:  yeast All questions were answered.  Orders Placed This Encounter  Procedures  . GC/Chlamydia Probe Amp  . Culture, beta strep (group b only)  . US OB Follow Up  . POCT urinalysis dipstick    Plan:  Continued routine obstetrical care, OTC yeast med  Return in about 1 week (around 03/20/2016) for LROB, US:EFW.

## 2016-03-13 NOTE — Patient Instructions (Signed)
Pick up an over the counter 7 day vaginal yeast infection treatment and start tonight.

## 2016-03-14 ENCOUNTER — Telehealth: Payer: Self-pay | Admitting: Women's Health

## 2016-03-14 NOTE — Telephone Encounter (Signed)
Patient called with concerns stating she thinks she "hit the baby's head" when she inserted the applicator. I told patient that if her baby was low in her pelvis that could be possible but not to worry about causing harm to the baby.  Pt verbalized understanding. No further questions.

## 2016-03-15 LAB — GC/CHLAMYDIA PROBE AMP
CHLAMYDIA, DNA PROBE: NEGATIVE
NEISSERIA GONORRHOEAE BY PCR: NEGATIVE

## 2016-03-16 LAB — CULTURE, BETA STREP (GROUP B ONLY): Strep Gp B Culture: NEGATIVE

## 2016-03-17 NOTE — L&D Delivery Note (Signed)
Delivery Note At 10:24 AM a viable female was delivered via Vaginal, Spontaneous Delivery (vertex, LOA ).  APGAR: 9, 9; weight is currently pending due to skin to skin.   Placenta status:intact.  Cord:  3-vessel, with no complications  Anesthesia:  epidural Episiotomy: None Lacerations: 1st degree;Perineal Suture Repair: No suturing required Est. Blood Loss (mL): 200  There was a nuchal cord x1, that waseasily reduced. The anterior shoulder delivered with ease. A 1 mindelayed cord clamping was performed. Cord clamped x2 and cut. Placenta delivered spontaneously intact, with 3VC. Fundus firm on exam with massage and pitocin. Good hemostasis noted.  Mom to postpartum.  Baby to Couplet care / Skin to Skin.  Gorden HarmsMegan Campbell 04/02/2016, 10:43 AM  OB FELLOW DELIVERY ATTESTATION  I was gloved and present for the delivery in its entirety, and I agree with the above resident's note.    Ernestina PennaNicholas Dewayne Jurek, MD 10:56 AM

## 2016-03-20 ENCOUNTER — Ambulatory Visit (INDEPENDENT_AMBULATORY_CARE_PROVIDER_SITE_OTHER): Payer: Medicaid Other

## 2016-03-20 ENCOUNTER — Ambulatory Visit (INDEPENDENT_AMBULATORY_CARE_PROVIDER_SITE_OTHER): Payer: Medicaid Other | Admitting: Advanced Practice Midwife

## 2016-03-20 ENCOUNTER — Encounter: Payer: Self-pay | Admitting: Advanced Practice Midwife

## 2016-03-20 VITALS — Wt 146.4 lb

## 2016-03-20 DIAGNOSIS — Z3403 Encounter for supervision of normal first pregnancy, third trimester: Secondary | ICD-10-CM

## 2016-03-20 DIAGNOSIS — Z3A38 38 weeks gestation of pregnancy: Secondary | ICD-10-CM

## 2016-03-20 DIAGNOSIS — O26843 Uterine size-date discrepancy, third trimester: Secondary | ICD-10-CM

## 2016-03-20 DIAGNOSIS — Z1389 Encounter for screening for other disorder: Secondary | ICD-10-CM

## 2016-03-20 DIAGNOSIS — Z331 Pregnant state, incidental: Secondary | ICD-10-CM

## 2016-03-20 LAB — POCT URINALYSIS DIPSTICK
Blood, UA: NEGATIVE
GLUCOSE UA: NEGATIVE
KETONES UA: NEGATIVE
LEUKOCYTES UA: NEGATIVE
NITRITE UA: NEGATIVE
Protein, UA: NEGATIVE

## 2016-03-20 NOTE — Progress Notes (Signed)
G1P0 10444w4d Estimated Date of Delivery: 04/06/16  Last menstrual period 07/01/2015.   BP weight and urine results all reviewed and noted.  Please refer to the obstetrical flow sheet for the fundal height and fetal heart rate documentation:   US 37+4 wks,cephalic,ant pl gr 2,afi 9.6 cm,fhr 136 bpm,bilat adnexa's wnl,EFW 3033 g 40%   Patient reports good fetal movement, denies any bleeding and no rupture of membranes symptoms or regular contractions. Patient is without complaints. All questions were answered.  No orders of the defined types were placed in this encounter.   Plan:  Continued routine obstetrical care,   Return in about 1 week (around 03/27/2016) for LROB.

## 2016-03-20 NOTE — Progress Notes (Signed)
US 37+4 wks,cephalic,ant pl gr 2,afi 9.6 cm,fhr 136 bpm,bilat adnexa's wnl,EFW 3033 g 40%

## 2016-03-24 ENCOUNTER — Telehealth: Payer: Self-pay

## 2016-03-24 ENCOUNTER — Encounter: Payer: Self-pay | Admitting: Women's Health

## 2016-03-24 NOTE — Telephone Encounter (Signed)
[facility]OUN[facility] Call taken by Clarita CraneKelly Winston RN 03/24/2016 1222  Caller states she spoke with someone and asked what to do about a sore throat and was referred to her OB office. When she called there they said they did not understand why she could not be helped by her PCP. They suggested she have an E-visit. Instructed to see PCP with in 3 days

## 2016-03-25 ENCOUNTER — Encounter: Payer: Self-pay | Admitting: Women's Health

## 2016-03-26 ENCOUNTER — Encounter: Payer: Self-pay | Admitting: Advanced Practice Midwife

## 2016-03-26 ENCOUNTER — Ambulatory Visit (INDEPENDENT_AMBULATORY_CARE_PROVIDER_SITE_OTHER): Payer: Medicaid Other | Admitting: Women's Health

## 2016-03-26 ENCOUNTER — Encounter: Payer: Self-pay | Admitting: Women's Health

## 2016-03-26 VITALS — BP 120/69 | HR 87 | Wt 149.0 lb

## 2016-03-26 DIAGNOSIS — O99513 Diseases of the respiratory system complicating pregnancy, third trimester: Secondary | ICD-10-CM

## 2016-03-26 DIAGNOSIS — Z331 Pregnant state, incidental: Secondary | ICD-10-CM

## 2016-03-26 DIAGNOSIS — Z1389 Encounter for screening for other disorder: Secondary | ICD-10-CM

## 2016-03-26 DIAGNOSIS — Z3403 Encounter for supervision of normal first pregnancy, third trimester: Secondary | ICD-10-CM

## 2016-03-26 DIAGNOSIS — Z3A38 38 weeks gestation of pregnancy: Secondary | ICD-10-CM | POA: Diagnosis not present

## 2016-03-26 DIAGNOSIS — O23593 Infection of other part of genital tract in pregnancy, third trimester: Secondary | ICD-10-CM

## 2016-03-26 LAB — POCT URINALYSIS DIPSTICK
Glucose, UA: NEGATIVE
Ketones, UA: NEGATIVE
Leukocytes, UA: NEGATIVE
NITRITE UA: NEGATIVE
Protein, UA: NEGATIVE
RBC UA: NEGATIVE

## 2016-03-26 NOTE — Patient Instructions (Addendum)
Call the office [phone]) or go to [facility] if:  You begin to have strong, frequent contractions  Your water breaks.  Sometimes it is a big gush of fluid, sometimes it is just a trickle that keeps getting your panties wet or running down your legs  You have vaginal bleeding.  It is normal to have a small amount of spotting if your cervix was checked.   You don't feel your baby moving like normal.  If you don't, get you something to eat and drink and lay down and focus on feeling your baby move.  You should feel at least 10 movements in 2 hours.  If you don't, you should call the office or go to [facility].   You have a viral infection that will resolve on its own over time.  Symptoms typically last 3-7 days but can stretch out to 2-3 weeks.  Unfortunately, antibiotics are not helpful for viral infections.  Humidifier and saline nasal spray for nasal congestion  Regular robitussin, cough drops for cough  Warm salt water gargles for sore throat  Mucinex with lots of water to help you cough up the mucous in your chest if needed  Drink plenty of fluids and stay hydrated!  Wash your hands frequently.  Call if you are not improving by 7-10 days.   Braxton Hicks Contractions Contractions of the uterus can occur throughout pregnancy. Contractions are not always a sign that you are in labor.  WHAT ARE BRAXTON HICKS CONTRACTIONS?  Contractions that occur before labor are called Braxton Hicks contractions, or false labor. Toward the end of pregnancy (32-34 weeks), these contractions can develop more often and may become more forceful. This is not true labor because these contractions do not result in opening (dilatation) and thinning of the cervix. They are sometimes difficult to tell apart from true labor because these contractions can be forceful and people have different pain tolerances. You should not feel embarrassed if you go to the hospital with false labor. Sometimes, the  only way to tell if you are in true labor is for your health care provider to look for changes in the cervix. If there are no prenatal problems or other health problems associated with the pregnancy, it is completely safe to be sent home with false labor and await the onset of true labor. HOW CAN YOU TELL THE DIFFERENCE BETWEEN TRUE AND FALSE LABOR? False Labor   The contractions of false labor are usually shorter and not as hard as those of true labor.   The contractions are usually irregular.   The contractions are often felt in the front of the lower abdomen and in the groin.   The contractions may go away when you walk around or change positions while lying down.   The contractions get weaker and are shorter lasting as time goes on.   The contractions do not usually become progressively stronger, regular, and closer together as with true labor.  True Labor   Contractions in true labor last 30-70 seconds, become very regular, usually become more intense, and increase in frequency.   The contractions do not go away with walking.   The discomfort is usually felt in the top of the uterus and spreads to the lower abdomen and low back.   True labor can be determined by your health care provider with an exam. This will show that the cervix is dilating and getting thinner.  WHAT TO REMEMBER  Keep up with your usual exercises  and follow other instructions given by your health care provider.   Take medicines as directed by your health care provider.   Keep your regular prenatal appointments.   Eat and drink lightly if you think you are going into labor.   If Braxton Hicks contractions are making you uncomfortable:   Change your position from lying down or resting to walking, or from walking to resting.   Sit and rest in a tub of warm water.   Drink 2-3 glasses of water. Dehydration may cause these contractions.   Do slow and deep breathing several times an hour.   WHEN SHOULD I SEEK IMMEDIATE MEDICAL CARE? Seek immediate medical care if:  Your contractions become stronger, more regular, and closer together.   You have fluid leaking or gushing from your vagina.   You have a fever.   You pass blood-tinged mucus.   You have vaginal bleeding.   You have continuous abdominal pain.   You have low back pain that you never had before.   You feel your baby's head pushing down and causing pelvic pressure.   Your baby is not moving as much as it used to.  This information is not intended to replace advice given to you by your health care provider. Make sure you discuss any questions you have with your health care provider. Document Released: 03/03/2005 Document Revised: 06/25/2015 Document Reviewed: 12/13/2012 Elsevier Interactive Patient Education  2017 [employer]

## 2016-03-26 NOTE — Progress Notes (Signed)
Low-risk OB appointment G1P0 3132w3d Estimated Date of Delivery: 04/06/16 BP 120/69   Pulse 87   Wt 149 lb (67.6 kg)   LMP 07/01/2015 (Exact Date)   BMI 27.25 kg/m   BP, weight, and urine reviewed.  Refer to obstetrical flow sheet for FH & FHR.  Reports good fm.  Denies regular uc's, lof, vb, or uti s/s. Cold sx since Sat, no fever/chills, just congestion, sore throat better. No erythema or exudate, some petechiae on soft palate- gave printed relief measures, call if not improved by Monday White d/c, thinks may be yeast, no itching/irritation/odor Spec exam: small amt creamy white nonodorous d/c, wet prep neg SVE per request: 1.5/th/-2, vtx FH still s<d, efw 40%/afi 9.6cm last week Reviewed labor s/s, fkc. Plan:  Continue routine obstetrical care  F/U in 1wk for OB appointment

## 2016-03-27 ENCOUNTER — Encounter: Payer: Self-pay | Admitting: Advanced Practice Midwife

## 2016-03-27 ENCOUNTER — Encounter: Payer: Self-pay | Admitting: *Deleted

## 2016-03-29 ENCOUNTER — Encounter (HOSPITAL_COMMUNITY): Payer: Self-pay | Admitting: *Deleted

## 2016-03-29 ENCOUNTER — Inpatient Hospital Stay (HOSPITAL_COMMUNITY)
Admission: AD | Admit: 2016-03-29 | Discharge: 2016-03-29 | Disposition: A | Discharge status: Home / Self Care | Payer: Medicaid Other | Source: Ambulatory Visit | Attending: Family Medicine | Admitting: Family Medicine

## 2016-03-29 DIAGNOSIS — O26893 Other specified pregnancy related conditions, third trimester: Secondary | ICD-10-CM | POA: Insufficient documentation

## 2016-03-29 DIAGNOSIS — Z3A38 38 weeks gestation of pregnancy: Secondary | ICD-10-CM | POA: Diagnosis not present

## 2016-03-29 DIAGNOSIS — N898 Other specified noninflammatory disorders of vagina: Secondary | ICD-10-CM | POA: Diagnosis not present

## 2016-03-29 LAB — POCT FERN TEST: POCT FERN TEST: NEGATIVE

## 2016-03-29 NOTE — MAU Provider Note (Signed)
S:  [name] is a 21 y.o. female G1P0 @ 6351w6d here with leaking of clear fluid, ? ROM.  She first noted leaking last night which was a very small amount. When we woke up this morning her underwear were soaked. She has had occasional contractions with + abdominal cramping.   O:  GENERAL: Well-developed, well-nourished female in no acute distress.  LUNGS: Effort normal SKIN: Warm, dry and without erythema PSYCH: Normal mood and affect Vagina - Small amount of white, mucoid vaginal discharge. No pooling of fluid. Cervix - No contact bleeding, no active bleeding  Bimanual exam: Cervix  Dilation: 1.5 Effacement (%): 50 Exam by:: Venia CarbonJennifer Laurelle Skiver, NP  Crist FatFern slide collected  Chaperone present for exam.   Vitals:   03/29/16 0825 03/29/16 0928  BP: 127/82 121/84  Pulse: 91 100  Resp: 18 18  Temp: 97.7 F (36.5 C)   TempSrc: Oral   SpO2:  100%  Weight: 147 lb (66.7 kg)   Height: 5\' 2"  (1.575 m)     MDM:  Fern slide negative.  No sign of PROM RN to discuss with Dr. Doristine CounterShenk   A:  Vaginal discharge in pregnancy  Fern slide negative for PROM  P:  RN to discuss with Dr. Doristine CounterShenk, likely DC home.    Duane LopeJennifer I Daylyn Christine, NP 03/29/2016 11:48 AM

## 2016-03-29 NOTE — MAU Note (Signed)
Patient presents with some leaking last night a small amount, this morning her underwear was soaked, clear fluid, cramping.

## 2016-03-29 NOTE — Discharge Instructions (Signed)
Braxton Hicks Contractions °Contractions of the uterus can occur throughout pregnancy. Contractions are not always a sign that you are in labor.  °WHAT ARE BRAXTON HICKS CONTRACTIONS?  °Contractions that occur before labor are called Braxton Hicks contractions, or false labor. Toward the end of pregnancy (32-34 weeks), these contractions can develop more often and may become more forceful. This is not true labor because these contractions do not result in opening (dilatation) and thinning of the cervix. They are sometimes difficult to tell apart from true labor because these contractions can be forceful and people have different pain tolerances. You should not feel embarrassed if you go to the hospital with false labor. Sometimes, the only way to tell if you are in true labor is for your health care provider to look for changes in the cervix. °If there are no prenatal problems or other health problems associated with the pregnancy, it is completely safe to be sent home with false labor and await the onset of true labor. °HOW CAN YOU TELL THE DIFFERENCE BETWEEN TRUE AND FALSE LABOR? °False Labor  °· The contractions of false labor are usually shorter and not as hard as those of true labor.   °· The contractions are usually irregular.   °· The contractions are often felt in the front of the lower abdomen and in the groin.   °· The contractions may go away when you walk around or change positions while lying down.   °· The contractions get weaker and are shorter lasting as time goes on.   °· The contractions do not usually become progressively stronger, regular, and closer together as with true labor.   °True Labor  °· Contractions in true labor last 30-70 seconds, become very regular, usually become more intense, and increase in frequency.   °· The contractions do not go away with walking.   °· The discomfort is usually felt in the top of the uterus and spreads to the lower abdomen and low back.   °· True labor can be  determined by your health care provider with an exam. This will show that the cervix is dilating and getting thinner.   °WHAT TO REMEMBER °· Keep up with your usual exercises and follow other instructions given by your health care provider.   °· Take medicines as directed by your health care provider.   °· Keep your regular prenatal appointments.   °· Eat and drink lightly if you think you are going into labor.   °· If Braxton Hicks contractions are making you uncomfortable:   °¨ Change your position from lying down or resting to walking, or from walking to resting.   °¨ Sit and rest in a tub of warm water.   °¨ Drink 2-3 glasses of water. Dehydration may cause these contractions.   °¨ Do slow and deep breathing several times an hour.   °WHEN SHOULD I SEEK IMMEDIATE MEDICAL CARE? °Seek immediate medical care if: °· Your contractions become stronger, more regular, and closer together.   °· You have fluid leaking or gushing from your vagina.   °· You have a fever.   °· You pass blood-tinged mucus.   °· You have vaginal bleeding.   °· You have continuous abdominal pain.   °· You have low back pain that you never had before.   °· You feel your baby's head pushing down and causing pelvic pressure.   °· Your baby is not moving as much as it used to.   °This information is not intended to replace advice given to you by your health care provider. Make sure you discuss any questions you have with your health care   provider. °Document Released: 03/03/2005 Document Revised: 06/25/2015 Document Reviewed: 12/13/2012 °Elsevier Interactive Patient Education © 2017 [employer] Inc[employer] ° °

## 2016-04-01 ENCOUNTER — Encounter (HOSPITAL_COMMUNITY): Payer: Self-pay | Admitting: *Deleted

## 2016-04-01 ENCOUNTER — Inpatient Hospital Stay (HOSPITAL_COMMUNITY)
Admission: AD | Admit: 2016-04-01 | Discharge: 2016-04-04 | DRG: 774 | Disposition: A | Discharge status: Home / Self Care | Payer: Medicaid Other | Source: Ambulatory Visit | Attending: Obstetrics and Gynecology | Admitting: Obstetrics and Gynecology

## 2016-04-01 DIAGNOSIS — Z87891 Personal history of nicotine dependence: Secondary | ICD-10-CM

## 2016-04-01 DIAGNOSIS — D649 Anemia, unspecified: Secondary | ICD-10-CM | POA: Diagnosis present

## 2016-04-01 DIAGNOSIS — Z3403 Encounter for supervision of normal first pregnancy, third trimester: Secondary | ICD-10-CM

## 2016-04-01 DIAGNOSIS — O1404 Mild to moderate pre-eclampsia, complicating childbirth: Secondary | ICD-10-CM | POA: Diagnosis present

## 2016-04-01 DIAGNOSIS — O411230 Chorioamnionitis, third trimester, not applicable or unspecified: Secondary | ICD-10-CM | POA: Diagnosis present

## 2016-04-01 DIAGNOSIS — O9902 Anemia complicating childbirth: Secondary | ICD-10-CM | POA: Diagnosis present

## 2016-04-01 DIAGNOSIS — Z3A39 39 weeks gestation of pregnancy: Secondary | ICD-10-CM

## 2016-04-01 DIAGNOSIS — Z3490 Encounter for supervision of normal pregnancy, unspecified, unspecified trimester: Secondary | ICD-10-CM

## 2016-04-01 DIAGNOSIS — Z8249 Family history of ischemic heart disease and other diseases of the circulatory system: Secondary | ICD-10-CM

## 2016-04-01 DIAGNOSIS — O26843 Uterine size-date discrepancy, third trimester: Secondary | ICD-10-CM | POA: Diagnosis present

## 2016-04-01 NOTE — MAU Note (Signed)
Patient presents with complaints of CTX approximately every 3-5 mins rated 7/10 and possible ROM.  Reports good Fm, no Vb, and light amount of clear fluid leaking for the past few hours.  No hx of problems with pregnancy.

## 2016-04-01 NOTE — MAU Note (Signed)
PT  SAYS UC  STRONG   SINCE  9PM.      SAYS SAT NIGHT   THOUGHT  SROM- CAME  HERE-  TOLD  MUCUS  PLUG.    VE  1-2   CM        THEN TONIGHT  AT  10PM-  I  MUCUS PLUG  CAME  OUT  THEN  LOOKED  WATERY.    LAST SEX-   1 WEEK AGO.   DENIES HSV AND MRSA .  GBS- NEG

## 2016-04-02 ENCOUNTER — Inpatient Hospital Stay (HOSPITAL_COMMUNITY): Payer: Medicaid Other | Admitting: Anesthesiology

## 2016-04-02 ENCOUNTER — Encounter (HOSPITAL_COMMUNITY): Payer: Self-pay | Admitting: Anesthesiology

## 2016-04-02 ENCOUNTER — Encounter: Payer: Medicaid Other | Admitting: Advanced Practice Midwife

## 2016-04-02 DIAGNOSIS — Z3493 Encounter for supervision of normal pregnancy, unspecified, third trimester: Secondary | ICD-10-CM | POA: Diagnosis present

## 2016-04-02 DIAGNOSIS — O9902 Anemia complicating childbirth: Secondary | ICD-10-CM | POA: Diagnosis present

## 2016-04-02 DIAGNOSIS — O26843 Uterine size-date discrepancy, third trimester: Secondary | ICD-10-CM | POA: Diagnosis present

## 2016-04-02 DIAGNOSIS — D649 Anemia, unspecified: Secondary | ICD-10-CM | POA: Diagnosis present

## 2016-04-02 DIAGNOSIS — Z8249 Family history of ischemic heart disease and other diseases of the circulatory system: Secondary | ICD-10-CM | POA: Diagnosis not present

## 2016-04-02 DIAGNOSIS — Z3490 Encounter for supervision of normal pregnancy, unspecified, unspecified trimester: Secondary | ICD-10-CM

## 2016-04-02 DIAGNOSIS — Z3A39 39 weeks gestation of pregnancy: Secondary | ICD-10-CM | POA: Diagnosis not present

## 2016-04-02 DIAGNOSIS — O411230 Chorioamnionitis, third trimester, not applicable or unspecified: Secondary | ICD-10-CM | POA: Diagnosis present

## 2016-04-02 DIAGNOSIS — Z87891 Personal history of nicotine dependence: Secondary | ICD-10-CM | POA: Diagnosis not present

## 2016-04-02 DIAGNOSIS — O1404 Mild to moderate pre-eclampsia, complicating childbirth: Secondary | ICD-10-CM | POA: Diagnosis present

## 2016-04-02 LAB — COMPREHENSIVE METABOLIC PANEL
ALK PHOS: 151 U/L — AB (ref 38–126)
ALT: 10 U/L — AB (ref 14–54)
AST: 21 U/L (ref 15–41)
Albumin: 3.1 g/dL — ABNORMAL LOW (ref 3.5–5.0)
Anion gap: 12 (ref 5–15)
BILIRUBIN TOTAL: 1.2 mg/dL (ref 0.3–1.2)
BUN: 8 mg/dL (ref 6–20)
CALCIUM: 9.4 mg/dL (ref 8.9–10.3)
CO2: 21 mmol/L — AB (ref 22–32)
CREATININE: 0.6 mg/dL (ref 0.44–1.00)
Chloride: 103 mmol/L (ref 101–111)
GFR calc non Af Amer: >60 mL/min (ref >60)
GLUCOSE: 118 mg/dL — AB (ref 65–99)
Potassium: 3.7 mmol/L (ref 3.5–5.1)
SODIUM: 136 mmol/L (ref 135–145)
TOTAL PROTEIN: 6.7 g/dL (ref 6.5–8.1)

## 2016-04-02 LAB — CBC
HCT: 40.4 % (ref 36.0–46.0)
Hemoglobin: 14.2 g/dL (ref 12.0–15.0)
MCH: 32.6 pg (ref 26.0–34.0)
MCHC: 35.1 g/dL (ref 30.0–36.0)
MCV: 92.7 fL (ref 78.0–100.0)
PLATELETS: 264 10*3/uL (ref 150–400)
RBC: 4.36 MIL/uL (ref 3.87–5.11)
RDW: 12.5 % (ref 11.5–15.5)
WBC: 17.2 10*3/uL — ABNORMAL HIGH (ref 4.0–10.5)

## 2016-04-02 LAB — ABO/RH: ABO/RH(D): A POS

## 2016-04-02 LAB — TYPE AND SCREEN
ABO/RH(D): A POS
ANTIBODY SCREEN: NEGATIVE

## 2016-04-02 LAB — PROTEIN / CREATININE RATIO, URINE
CREATININE, URINE: 128 mg/dL
Protein Creatinine Ratio: 0.85 mg/mg{Cre} — ABNORMAL HIGH (ref 0.00–0.15)
Total Protein, Urine: 109 mg/dL

## 2016-04-02 MED ORDER — EPHEDRINE 5 MG/ML INJ
10.0000 mg | INTRAVENOUS | Status: DC | PRN
  Filled 2016-04-02: qty 4

## 2016-04-02 MED ORDER — LIDOCAINE HCL (PF) 1 % IJ SOLN
INTRAMUSCULAR | Status: DC | PRN
  Administered 2016-04-02: 8 mL via EPIDURAL
  Administered 2016-04-02: 5 mL via EPIDURAL

## 2016-04-02 MED ORDER — LACTATED RINGERS IV SOLN
500.0000 mL | INTRAVENOUS | Status: DC | PRN
  Administered 2016-04-02: 300 mL via INTRAVENOUS

## 2016-04-02 MED ORDER — ZOLPIDEM TARTRATE 5 MG PO TABS: 5.0000 mg | ORAL_TABLET | Freq: Every evening | ORAL | Status: DC | PRN

## 2016-04-02 MED ORDER — LACTATED RINGERS IV SOLN
INTRAVENOUS | Status: DC
  Administered 2016-04-02: 06:00:00 via INTRAUTERINE

## 2016-04-02 MED ORDER — HYDRALAZINE HCL 20 MG/ML IJ SOLN: 10.0000 mg | Freq: Once | INTRAMUSCULAR | Status: DC | PRN

## 2016-04-02 MED ORDER — DIBUCAINE 1 % RE OINT
1.0000 "application " | TOPICAL_OINTMENT | RECTAL | Status: DC | PRN
  Filled 2016-04-02: qty 28

## 2016-04-02 MED ORDER — COCONUT OIL OIL: 1.0000 "application " | TOPICAL_OIL | Status: DC | PRN

## 2016-04-02 MED ORDER — OXYTOCIN BOLUS FROM INFUSION
500.0000 mL | Freq: Once | INTRAVENOUS | Status: AC
  Administered 2016-04-02: 500 mL via INTRAVENOUS

## 2016-04-02 MED ORDER — FENTANYL CITRATE (PF) 100 MCG/2ML IJ SOLN
100.0000 ug | INTRAMUSCULAR | Status: DC | PRN
  Administered 2016-04-02 (×2): 100 ug via INTRAVENOUS
  Filled 2016-04-02 (×2): qty 2

## 2016-04-02 MED ORDER — OXYCODONE-ACETAMINOPHEN 5-325 MG PO TABS: 1.0000 | ORAL_TABLET | ORAL | Status: DC | PRN

## 2016-04-02 MED ORDER — SENNOSIDES-DOCUSATE SODIUM 8.6-50 MG PO TABS
2.0000 | ORAL_TABLET | ORAL | Status: DC
  Administered 2016-04-03 (×2): 2 via ORAL
  Filled 2016-04-02 (×2): qty 2

## 2016-04-02 MED ORDER — PRENATAL MULTIVITAMIN CH
1.0000 | ORAL_TABLET | Freq: Every day | ORAL | Status: DC
  Administered 2016-04-02 – 2016-04-03 (×2): 1 via ORAL
  Filled 2016-04-02 (×3): qty 1

## 2016-04-02 MED ORDER — FLEET ENEMA 7-19 GM/118ML RE ENEM: 1.0000 | ENEMA | RECTAL | Status: DC | PRN

## 2016-04-02 MED ORDER — WITCH HAZEL-GLYCERIN EX PADS: 1.0000 "application " | MEDICATED_PAD | CUTANEOUS | Status: DC | PRN

## 2016-04-02 MED ORDER — IBUPROFEN 600 MG PO TABS
600.0000 mg | ORAL_TABLET | Freq: Four times a day (QID) | ORAL | Status: DC
  Administered 2016-04-02 – 2016-04-04 (×8): 600 mg via ORAL
  Filled 2016-04-02 (×10): qty 1

## 2016-04-02 MED ORDER — ONDANSETRON HCL 4 MG/2ML IJ SOLN: 4.0000 mg | INTRAMUSCULAR | Status: DC | PRN

## 2016-04-02 MED ORDER — LACTATED RINGERS IV SOLN
INTRAVENOUS | Status: DC
  Administered 2016-04-02: 01:00:00 via INTRAVENOUS

## 2016-04-02 MED ORDER — ACETAMINOPHEN 325 MG PO TABS: 650.0000 mg | ORAL_TABLET | ORAL | Status: DC | PRN

## 2016-04-02 MED ORDER — ONDANSETRON HCL 4 MG/2ML IJ SOLN: 4.0000 mg | Freq: Four times a day (QID) | INTRAMUSCULAR | Status: DC | PRN

## 2016-04-02 MED ORDER — SODIUM CHLORIDE 0.9 % IV SOLN
2.0000 g | Freq: Four times a day (QID) | INTRAVENOUS | Status: DC
  Administered 2016-04-02 (×2): 2 g via INTRAVENOUS
  Filled 2016-04-02 (×2): qty 2000

## 2016-04-02 MED ORDER — LABETALOL HCL 5 MG/ML IV SOLN: 20.0000 mg | INTRAVENOUS | Status: DC | PRN

## 2016-04-02 MED ORDER — BENZOCAINE-MENTHOL 20-0.5 % EX AERO
1.0000 "application " | INHALATION_SPRAY | CUTANEOUS | Status: DC | PRN
  Filled 2016-04-02: qty 56

## 2016-04-02 MED ORDER — DIPHENHYDRAMINE HCL 50 MG/ML IJ SOLN: 12.5000 mg | INTRAMUSCULAR | Status: DC | PRN

## 2016-04-02 MED ORDER — OXYCODONE-ACETAMINOPHEN 5-325 MG PO TABS: 2.0000 | ORAL_TABLET | ORAL | Status: DC | PRN

## 2016-04-02 MED ORDER — LIDOCAINE HCL (PF) 1 % IJ SOLN
30.0000 mL | INTRAMUSCULAR | Status: DC | PRN
  Filled 2016-04-02: qty 30

## 2016-04-02 MED ORDER — GENTAMICIN SULFATE 40 MG/ML IJ SOLN
150.0000 mg | Freq: Three times a day (TID) | INTRAVENOUS | Status: DC
  Administered 2016-04-02: 150 mg via INTRAVENOUS
  Filled 2016-04-02 (×2): qty 3.75

## 2016-04-02 MED ORDER — ACETAMINOPHEN 325 MG PO TABS
650.0000 mg | ORAL_TABLET | ORAL | Status: DC | PRN
  Filled 2016-04-02: qty 2

## 2016-04-02 MED ORDER — LACTATED RINGERS IV SOLN: 500.0000 mL | Freq: Once | INTRAVENOUS | Status: DC

## 2016-04-02 MED ORDER — FENTANYL 2.5 MCG/ML BUPIVACAINE 1/10 % EPIDURAL INFUSION (WH - ANES)
14.0000 mL/h | INTRAMUSCULAR | Status: DC | PRN
  Administered 2016-04-02: 14 mL/h via EPIDURAL
  Filled 2016-04-02: qty 100

## 2016-04-02 MED ORDER — DIPHENHYDRAMINE HCL 25 MG PO CAPS: 25.0000 mg | ORAL_CAPSULE | Freq: Four times a day (QID) | ORAL | Status: DC | PRN

## 2016-04-02 MED ORDER — LACTATED RINGERS IV BOLUS (SEPSIS): 500.0000 mL | Freq: Once | INTRAVENOUS | Status: DC

## 2016-04-02 MED ORDER — PHENYLEPHRINE 40 MCG/ML (10ML) SYRINGE FOR IV PUSH (FOR BLOOD PRESSURE SUPPORT)
80.0000 ug | PREFILLED_SYRINGE | INTRAVENOUS | Status: DC | PRN
  Filled 2016-04-02: qty 5

## 2016-04-02 MED ORDER — SOD CITRATE-CITRIC ACID 500-334 MG/5ML PO SOLN: 30.0000 mL | ORAL | Status: DC | PRN

## 2016-04-02 MED ORDER — ONDANSETRON HCL 4 MG PO TABS: 4.0000 mg | ORAL_TABLET | ORAL | Status: DC | PRN

## 2016-04-02 MED ORDER — PRENATAL MULTIVITAMIN CH: 1.0000 | ORAL_TABLET | Freq: Every day | ORAL | Status: DC

## 2016-04-02 MED ORDER — TETANUS-DIPHTH-ACELL PERTUSSIS 5-2.5-18.5 LF-MCG/0.5 IM SUSP: 0.5000 mL | Freq: Once | INTRAMUSCULAR | Status: DC

## 2016-04-02 MED ORDER — INFLUENZA VAC SPLIT QUAD 0.5 ML IM SUSY
0.5000 mL | PREFILLED_SYRINGE | INTRAMUSCULAR | Status: AC
  Administered 2016-04-04: 0.5 mL via INTRAMUSCULAR
  Filled 2016-04-02: qty 0.5

## 2016-04-02 MED ORDER — PHENYLEPHRINE 40 MCG/ML (10ML) SYRINGE FOR IV PUSH (FOR BLOOD PRESSURE SUPPORT)
80.0000 ug | PREFILLED_SYRINGE | INTRAVENOUS | Status: DC | PRN
  Filled 2016-04-02: qty 10
  Filled 2016-04-02: qty 5

## 2016-04-02 MED ORDER — OXYTOCIN 40 UNITS IN LACTATED RINGERS INFUSION - SIMPLE MED
2.5000 [IU]/h | INTRAVENOUS | Status: DC
  Administered 2016-04-02: 2.5 [IU]/h via INTRAVENOUS
  Filled 2016-04-02: qty 1000

## 2016-04-02 MED ORDER — SIMETHICONE 80 MG PO CHEW
80.0000 mg | CHEWABLE_TABLET | ORAL | Status: DC | PRN
Start: 2016-04-02 — End: 2016-04-04

## 2016-04-02 NOTE — Progress Notes (Addendum)
OB Interim Progress Note  S: Called by RN for repeated variable decels.  Patient appearing comfortable, now with epidural placed.   O: BP 140/82   Pulse 76   Temp 99.1 F (37.3 C) (Axillary)   Resp 18   Ht 5\' 2"  (1.575 m)   Wt 66.2 kg (146 lb)   LMP 07/01/2015 (Exact Date)   SpO2 97%   BMI 26.70 kg/m    Dilation 8 Effacement: 90% Station: +1  FHT:  FHR: 140s bpm, variability: marked,  accelerations:  Abscent, variable decelerations UC: 2.5-3 min  A/P: Progressing well with epidural placed, now at 8cm dilation.   Continuing to have variable decels in the setting of prolonged ROM and presumed chorio. Placed IUPC and will start amioinfusion. Will continue to closely monitor.  Continue expectant management Anticipate SVD  Renne Muscaaniel L Valda Christenson, MD 04/02/2016, 5:52 AM PGY-1

## 2016-04-02 NOTE — Progress Notes (Signed)
Patient ID: Sara [name], female   DOB: 1995-10-26, 21 y.o.   MRN: [id number] Sara MelnickKayla F Affeldt is a 21 y.o. G1P0 at 1538w3d admitted for active labor/prolonged srom  Subjective: Uncomfortable w/ uc's, recently got dose of fentanyl, which helps some. Denies ha, visual changes, ruq/epigastric pain, n/v.    Objective: BP (!) 143/94   Pulse (!) 115   Temp 99.6 F (37.6 C) (Oral)   Resp 18   Ht 5\' 2"  (1.575 m)   Wt 66.2 kg (146 lb)   LMP 07/01/2015 (Exact Date)   BMI 26.70 kg/m  No intake/output data recorded.  FHT:  FHR: 175 bpm, variability: marked,  accelerations:  Abscent,  decelerations:  Absent UC:   q 1-394mins  SVE:   Dilation: 6.5 Effacement (%): 90 Station: 0 Exam by:: OmnicomBooker, CNM  Labs: Lab Results  Component Value Date   WBC 17.2 (H) 04/02/2016   HGB 14.2 04/02/2016   HCT 40.4 04/02/2016   MCV 92.7 04/02/2016   PLT 264 04/02/2016    Assessment / Plan: Spontaneous labor, progressing normally, prolonged rom going on 26hrs, WBC 17.2, temp 99.6, fetal tachycardia w/ marked variability- will start amp/gent for presumed chorio, 500ml LR bolus, change positions  Labor: Progressing normally Fetal Wellbeing:  Category II Pain Control:  IV pain meds Pre-eclampsia: Cr, LFTs, platelets normal, P:C still pending, bp's stable- had 1 isolated severe range, none since I/D:  Amp/gent for prolonged ROM/presumed chorio Anticipated MOD:  NSVD  Marge DuncansBooker, Jomes Giraldo Randall CNM, WHNP-BC 04/02/2016, 0240  0300: FHR baseline 155, mod variability, +accels, no decels

## 2016-04-02 NOTE — Anesthesia Preprocedure Evaluation (Signed)
Anesthesia Evaluation  Patient identified by MRN, date of birth, ID band Patient awake    Reviewed: Allergy & Precautions, H&P , NPO status , Patient's Chart, lab work & pertinent test results  Airway Mallampati: II  TM Distance: >3 FB Neck ROM: full    Dental no notable dental hx.    Pulmonary former smoker,    Pulmonary exam normal        Cardiovascular negative cardio ROS Normal cardiovascular exam     Neuro/Psych negative neurological ROS  negative psych ROS   GI/Hepatic negative GI ROS, Neg liver ROS,   Endo/Other  negative endocrine ROS  Renal/GU      Musculoskeletal   Abdominal Normal abdominal exam  (+)   Peds  Hematology negative hematology ROS (+)   Anesthesia Other Findings   Reproductive/Obstetrics (+) Pregnancy                             Anesthesia Physical Anesthesia Plan  ASA: II  Anesthesia Plan: Epidural   Post-op Pain Management:    Induction:   Airway Management Planned:   Additional Equipment:   Intra-op Plan:   Post-operative Plan:   Informed Consent: I have reviewed the patients History and Physical, chart, labs and discussed the procedure including the risks, benefits and alternatives for the proposed anesthesia with the patient or authorized representative who has indicated his/her understanding and acceptance.     Plan Discussed with:   Anesthesia Plan Comments:         Anesthesia Quick Evaluation

## 2016-04-02 NOTE — Anesthesia Procedure Notes (Signed)
Epidural Patient location during procedure: OB Start time: 04/02/2016 3:59 AM End time: 04/02/2016 4:01 AM  Staffing Anesthesiologist: Leilani AbleHATCHETT, Kana Reimann Performed: anesthesiologist   Preanesthetic Checklist Completed: patient identified, surgical consent, pre-op evaluation, timeout performed, IV checked, risks and benefits discussed and monitors and equipment checked  Epidural Patient position: sitting Prep: site prepped and draped and DuraPrep Patient monitoring: continuous pulse ox and blood pressure Approach: midline Injection technique: LOR air  Needle:  Needle type: Tuohy  Needle gauge: 17 G Needle length: 9 cm and 9 Needle insertion depth: 5 cm cm Catheter type: closed end flexible Catheter size: 19 Gauge Catheter at skin depth: 10 cm Test dose: negative and Other  Assessment Sensory level: T9 Events: blood not aspirated, injection not painful, no injection resistance, negative IV test and no paresthesia  Additional Notes Reason for block:procedure for pain

## 2016-04-02 NOTE — Progress Notes (Signed)
S: Called by RN to evaluate patient for some spotting.    O:  VSS Cardio: no edema Resp: breathing comfortably on room air GU: mild labial edema, small clots in vaginal vault, no active bleeding  Chaperone present for exam  A/P:  Patient denies any symptoms and VSS. She had a 1st degree perineal laceration that did not require any repair. Discussed with nurse that she can call me with any concerns. She did have some blood on her pad that was replaced about prior to my visit.  I removed a few small clots from the vagina and did not appreciate any active bleeding.   - will continue to monitor - will consider methergine  - AM CBC if significant bleeding

## 2016-04-02 NOTE — Anesthesia Pain Management Evaluation Note (Signed)
  CRNA Pain Management Visit Note  Patient: Sara MelnickKayla F [name], 21 y.o., female  "Hello I am a member of the anesthesia team at [facility]. We have an anesthesia team available at all times to provide care throughout the hospital, including epidural management and anesthesia for C-section. I don't know your plan for the delivery whether it a natural birth, water birth, IV sedation, nitrous supplementation, doula or epidural, but we want to meet your pain goals."   1.Was your pain managed to your expectations on prior hospitalizations?   No prior hospitalizations  2.What is your expectation for pain management during this hospitalization?     Epidural  3.How can we help you reach that goal? Epidural in situ.  Record the patient's initial score and the patient's pain goal.   Pain: 0  Pain Goal: 5 [facility] wants you to be able to say your pain was always managed very well.  [name] L 04/02/2016

## 2016-04-02 NOTE — H&P (Signed)
LABOR AND DELIVERY ADMISSION HISTORY AND PHYSICAL NOTE  Sara MelnickKayla F [name] is a 21 y.o. female G1P0 with IUP at 5315w3d by US presenting for labor.  Was seen three days ago in MAU for possible leakage of fluid and was noted to have a small amount of white mucous vaginal discharge without pooling in the vaginal vault and a fern slide was also negative.  She was dilated to 1.5cm with 50% effacement at that time.  Presenting again today with concern for leakage of fluid and regular contractions. Today with the fluid soaking her underwear.  In the MAU she had fluid in the vaginal vault and was dilated to 5-6cm with 90% effacement and 0. She denies any vaginal bleeding and endorses good fetal movement.  Blood pressure also elevated in MAU to 142/100 and patient also endorses seeing some spots for the past several hours.  Denies any headaches, CP, SOB, NV or diarrhea.   Prenatal History/Complications:  Past Medical History: Past Medical History:  Diagnosis Date  . Asthma   . Kidney stone    UTI    Past Surgical History: Past Surgical History:  Procedure Laterality Date  . CYSTOSCOPY WITH URETEROSCOPY, STONE BASKETRY AND STENT PLACEMENT Right 10/06/2014   Procedure: CYSTOSCOPY WITH URETEROSCOPY, STONE BASKETRY AND STENT PLACEMENT, RETROGRADE;  Surgeon: Malen GauzePatrick L McKenzie, MD;  Location: [facility];  Service: Urology;  Laterality: Right;  . TONSILLECTOMY      Obstetrical History: OB History    Gravida Para Term Preterm AB Living   1             SAB TAB Ectopic Multiple Live Births                  Social History: Social History   Social History  . Marital status: Single    Spouse name: N/A  . Number of children: N/A  . Years of education: N/A   Social History Main Topics  . Smoking status: Former Smoker    Quit date: 06/13/2015  . Smokeless tobacco: Never Used  . Alcohol use No  . Drug use: No  . Sexual activity: Yes    Birth control/ protection: None   Other Topics Concern  . None    Social History Narrative   Lives with father, recovered alcoholic, mother abandoned family- reportedly abuses drugs and alcohol       Family History: Family History  Problem Relation Age of Onset  . Bipolar disorder Mother   . Heart murmur Mother   . Alcohol abuse Mother   . Cancer Mother     breast  . Hypertension Father   . Hypertension Paternal Grandfather   . Arthritis Neg Hx     Allergies: No Known Allergies  Prescriptions Prior to Admission  Medication Sig Dispense Refill Last Dose  . Prenatal Vit-Fe Fumarate-FA (PRENATAL MULTIVITAMIN) TABS tablet Take 1 tablet by mouth at bedtime.   Taking     Review of Systems   All systems reviewed and negative except as stated in HPI  Blood pressure 142/100, pulse 111, temperature 97.8 F (36.6 C), temperature source Oral, resp. rate 20, height 5\' 2"  (1.575 m), weight 66.5 kg (146 lb 8 oz), last menstrual period 07/01/2015. General appearance: alert and cooperative Lungs: clear to auscultation bilaterally Heart: regular rate and rhythm Abdomen: soft, non-tender; bowel sounds normal Extremities: No calf swelling or tenderness Presentation: cephalic vertex position Fetal monitoring: 140s, moderate variability, +accels, no decels, regular contractions Uterine activity: dilation: 5cm, 90% effacement,  0 station   Prenatal labs: ABO, Rh: A/Positive/-- (07/12 1630) Antibody: Negative (10/26 0912) Rubella: Immune RPR: Non Reactive (10/26 0912)  HBsAg: Negative (07/12 1630)  HIV: Non Reactive (10/26 0912)  GBS:    1 hr Glucola: 2hr normal 90/143/125 Genetic screening:  Neg Anatomy US: Normal  Prenatal Transfer Tool  Maternal Diabetes: No Genetic Screening: Normal Maternal Ultrasounds/Referrals: Normal Fetal Ultrasounds or other Referrals:  None Maternal Substance Abuse:  No Significant Maternal Medications:  None Significant Maternal Lab Results: None  No results found for this or any previous visit (from the past  24 hour(s)).  Patient Active Problem List   Diagnosis Date Noted  . Uterine size date discrepancy 03/13/2016  . Supervision of normal first pregnancy 09/26/2015  . Renal stone 10/05/2014  . Nephrolithiasis 10/05/2014  . Ureteral stone 10/05/2014  . Pain in joint, lower leg 05/29/2010    Assessment: Sara [name] is a 21 y.o. G1P0 at [redacted]w[redacted]d here for labor  #Labor: expectant management for SVD #Pain: IV pain medication #FWB: Category I #ID: GBS neg #MOF: Breast #MOC:depo #Circ:  Yes, at FT  Renne Musca, MD PGY-1 04/02/2016, 12:18 AM  I spoke with and examined patient and agree with resident/PA/SNM's note and plan of care.  G1P0 @ [redacted]w[redacted]d w/ reported LOF 1/16 @ [facility], then again 2hr prior to arrival tonight. UCs throughout the day. Denies ha, visual changes, ruq/epigastric pain, n/v.   Pregnancy complicated by s<d, efw 40%/afi 9.6cm @ 37wks Early active labor Possible prolonged ROM, afebrile, GBS neg Cat 1 FHR, uc's q 2-20min SSE: +pooling clear fluid, fern + SVE: 5-6/90/0, +bloody show Elevated bp's, asymptomatic, exam normal, will check pre-e labs, Labetalol orders placed, will start mag if severe range bp's Expectant management  Cheral Marker, CNM, WHNP-BC 04/02/2016 1:00 AM

## 2016-04-02 NOTE — Anesthesia Postprocedure Evaluation (Signed)
Anesthesia Post Note  Patient: Sara [name]  Procedure(s) Performed: * No procedures listed *  Patient location during evaluation: Mother Baby Anesthesia Type: Epidural Level of consciousness: awake Pain management: pain level controlled Vital Signs Assessment: post-procedure vital signs reviewed and stable Cardiovascular status: stable Postop Assessment: no headache, no backache, epidural receding and patient able to bend at knees Anesthetic complications: no        Last Vitals:  Vitals:   04/02/16 1210 04/02/16 1320  BP: (!) 141/76 (!) 116/58  Pulse: 89 (!) 115  Resp: 19 20  Temp: 37.5 C 36.8 C    Last Pain:  Vitals:   04/02/16 1353  TempSrc:   PainSc: 3    Pain Goal:                 Edison PaceWILKERSON,[name]

## 2016-04-02 NOTE — Progress Notes (Signed)
ANTIBIOTIC CONSULT NOTE - INITIAL  Pharmacy Consult for Gentamicin Indication: Chorioamnionitis   No Known Allergies  Patient Measurements: Height: 5\' 2"  (157.5 cm) Weight: 146 lb (66.2 kg) IBW/kg (Calculated) : 50.1 Adjusted Body Weight: 54.9 kg  Vital Signs: Temp: 99.6 F (37.6 C) (01/17 0215) Temp Source: Oral (01/17 0215) BP: 143/94 (01/17 0216) Pulse Rate: 115 (01/17 0216)  Labs:  Recent Labs  04/02/16 0035  WBC 17.2*  HGB 14.2  PLT 264  CREATININE 0.60   No results for input(s): GENTTROUGH, GENTPEAK, GENTRANDOM in the last 72 hours.   Microbiology: Recent Results (from the past 720 hour(s))  OB RESULT CONSOLE Group B Strep     Status: None   Collection Time: 03/13/16 12:00 AM  Result Value Ref Range Status   GBS Negative  Final  GC/Chlamydia Probe Amp     Status: None   Collection Time: 03/13/16  4:00 PM  Result Value Ref Range Status   Chlamydia trachomatis, NAA Negative Negative Final   Neisseria gonorrhoeae by PCR Negative Negative Final  Culture, beta strep (group b only)     Status: None   Collection Time: 03/13/16  4:00 PM  Result Value Ref Range Status   Strep Gp B Culture Negative Negative Final    Comment: Centers for Disease Control and Prevention (CDC) and American Congress of Obstetricians and Gynecologists (ACOG) guidelines for prevention of perinatal group B streptococcal (GBS) disease specify co-collection of a vaginal and rectal swab specimen to maximize sensitivity of GBS detection. Per the CDC and ACOG, swabbing both the lower vagina and rectum substantially increases the yield of detection compared with sampling the vagina alone. Penicillin G, ampicillin, or cefazolin are indicated for intrapartum prophylaxis of perinatal GBS colonization. Reflex susceptibility testing should be performed prior to use of clindamycin only on GBS isolates from penicillin-allergic women who are considered a high risk for anaphylaxis. Treatment with  vancomycin without additional testing is warranted if resistance to clindamycin is noted.     Medications:  Ampicillin 2 grams Q6 1/17>>  Assessment: 21 y.o. female G1P0 at 6435w3d admitted for labor now with prolonged ROM and presumed chorioamnionitis.  Estimated Ke = 0.355 hr-1, Vd = 0.38 L/kg  Goal of Therapy:  Gentamicin peak 6-8 mg/L and Trough < 1 mg/L  Plan:  Gentamicin 150 mg IV every 8 hrs  Check Scr with next labs if gentamicin continued. Will check gentamicin levels if continued > 72hr or clinically indicated.  Burk Hoctor Scarlett 04/02/2016,2:47 AM

## 2016-04-03 LAB — CBC
HCT: 30.7 % — ABNORMAL LOW (ref 36.0–46.0)
HEMOGLOBIN: 10.7 g/dL — AB (ref 12.0–15.0)
MCH: 32.7 pg (ref 26.0–34.0)
MCHC: 34.9 g/dL (ref 30.0–36.0)
MCV: 93.9 fL (ref 78.0–100.0)
Platelets: 206 10*3/uL (ref 150–400)
RBC: 3.27 MIL/uL — ABNORMAL LOW (ref 3.87–5.11)
RDW: 12.9 % (ref 11.5–15.5)
WBC: 12.2 10*3/uL — AB (ref 4.0–10.5)

## 2016-04-03 LAB — RPR: RPR: NONREACTIVE

## 2016-04-03 MED ORDER — METHYLERGONOVINE MALEATE 0.2 MG PO TABS
0.2000 mg | ORAL_TABLET | Freq: Three times a day (TID) | ORAL | Status: DC
  Administered 2016-04-03 – 2016-04-04 (×5): 0.2 mg via ORAL
  Filled 2016-04-03 (×5): qty 1

## 2016-04-03 NOTE — Clinical Social Work Maternal (Signed)
  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 [place], [place] [address]  Phone number:  [phone]   Household Members:  Self, Parents (MOB resides with MOB's father. )   Natural Supports (not living in the home):  Spouse/significant other (FOB's family will provide support to MOB and baby. )   Professional Supports:     Employment: Unemployed   Type of Work:     Education:      Pensions consultant:  [employer]   Other Resources:  Physicist, medical , [place] Considerations Which May Impact Care:  Per [employer] Sheet, MOB is Holiness/[employer]  Strengths:  Ability to meet basic needs , Home prepared for child    Risk Factors/Current Problems:  Substance Use    Cognitive State:  Alert , Able to Concentrate , Linear Thinking    Mood/Affect:  Flat , Calm , Relaxed , Comfortable    CSW Assessment: CSW met with MOB to complete an assessment for hx of  THC use.  MOB was receptive to meeting with CSW.  When CSW arrived, MOB was resting in bed and infant was asleep in bassinet.  FOB [name]) entered while CSW was completing the assessment, and MOB gave CSW to permission to continue while FOB was present.  CSW inquired about MOB's substance use hx, and MOB acknowledged the use of marijuana prior to pregnancy confirmation.  MOB denied the use of any substance after pregnancy confirmation. CSW informed MOB of the hospital's drug screen policy, and informed MOB  of the 2 screenings for the infant. MOB appeared understanding and communicated she was not concerned about the infant having a positive UDS or CDS. CSW shared with MOB that the infant's UDS negative and CSW will continue to monitor the infant's CDS. CSW made MOB aware that if the infant's CDS is positive without an explanation, CSW will make a report to [facility] CPS.  MOB did not have any questions regarding the hospital's policy. CSW offered MOB resources and referrals for SA, and MOB declined.  CSW educated MOB about PPD. CSW informed MOB of possible supports and interventions to decrease PPD.  CSW also encouraged MOB to seek medical attention if needed for increased signs and symptoms for PPD. CSW reviewed safe sleep, and SIDS. MOB was knowledgeable and asked appropriate questions.  MOB communicated that she has a crib and bassinet for the baby, and feels prepared for the infant.  MOB did not have any further questions, concerns, or needs at this time. CSW thanked MOB for meeting with CSW and provided MOB with CSW contact information.  CSW Plan/Description:  Information/Referral to [employer] , No Further Intervention Required/No Barriers to Discharge, Patient/Family Education  (CSW will continue to monitor infant's CDS and will make a report if needed. )   Laurey Arrow, MSW, LCSW Clinical Social Work [phone]    Dimple Nanas, LCSW 04/03/2016, 11:47 AM

## 2016-04-03 NOTE — Progress Notes (Signed)
Pt has continued to bleed throughout the shift   At 2230, pt observed to have continued trickling with fundal check, difficulty urinating. Bladder scan indicated 152 ml of urine, BP was 124/75. Due to continued bleeding, called Md. Resident Elwanda Brooklynaniel Warden assessed pt, extracted a few small clots.  At approx 2300, requested order for methergine, Md requested to continue to monitor before putting order in.  At fundal check during routine 8 hour check at 0045, pt continued to have trickling with fundal check, BP was 121/78. Called Md, obtained order for methergine to start at 1000 on 1/18. At 0245 clarified with MD to start methergine at 1000 and not 0200. Md confirmed this was correct.   At 0500, pt passed clot approximately the size of an orange. Pt's BP was 139/71, she was shaking due to stated "panic" but exhibited no other neurological symptoms. Fundal check showed midline fundus 2 under umbilicus, with trickling bleeding. Notified Md, order in for CBC at 0500.

## 2016-04-03 NOTE — Progress Notes (Signed)
UR chart review completed.  

## 2016-04-03 NOTE — Progress Notes (Signed)
Contacted CRNA concerning pt still having trouble feeling the urge to void, she directed me to Dr. Jean RosenthalJackson, on-call anesthesiologist. Dr.Jackson stated that he would speak with Dr. Debroah LoopArnold concerning pt's condition.

## 2016-04-03 NOTE — Lactation Note (Signed)
This note was copied from a baby's chart. Lactation Consultation Note  Patient Name: Sara [name] ZOXWR'UToday's Date: 04/03/2016 Reason for consult: Follow-up assessment Called to assist Mom with latch, baby having difficulty sustaining latch at breast. It took few attempts, changing positions and using breast compression than baby was able to sustain the latch for 20 minutes on left breast in cross cradle, 12 minutes on right breast in football hold. Mom has some extra bleeding after delivery, her hgb dropped from 14.2 to 10.7. Set up DEBP for Mom to start post pumping every 3 hours for 15 minutes to encourage milk production and to have EBM to supplement if baby's latch does not continue to improve. Baby does have anterior lingual frenulum with some tongue restriction but frenulum appears to be stretching with crying. Advised Mom baby needs to be at breast 8-12 times in 24 hours and with feeding cue. Cluster feeding discussed. Advised baby should be nursing for 15-30 minutes both breasts some feedings. If Mom continue to have difficulty getting baby to sustain the latch, Mom or RN to advise, may need to initiate nipple shield. With pumping advised Mom to give any amount of EBM she receives back to baby. Call for assist as needed.   Maternal Data    Feeding Feeding Type: Breast Fed Length of feed: 12 min  LATCH Score/Interventions Latch: Repeated attempts needed to sustain latch, nipple held in mouth throughout feeding, stimulation needed to elicit sucking reflex. Intervention(s): Skin to skin Intervention(s): Adjust position;Assist with latch;Breast massage;Breast compression  Audible Swallowing: A few with stimulation Intervention(s): Skin to skin Intervention(s): Skin to skin  Type of Nipple: Everted at rest and after stimulation Intervention(s): Double electric pump  Comfort (Breast/Nipple): Soft / non-tender     Hold (Positioning): Assistance needed to correctly position infant at  breast and maintain latch. Intervention(s): Breastfeeding basics reviewed;Support Pillows;Position options;Skin to skin  LATCH Score: 7  Lactation Tools Discussed/Used Tools: Pump;Shells Shell Type: Inverted Breast pump type: Double-Electric Breast Pump   Consult Status Consult Status: Follow-up Date: 04/04/16 Follow-up type: In-patient    Alfred LevinsGranger, [name] 04/03/2016, 5:48 PM

## 2016-04-03 NOTE — Progress Notes (Signed)
Pt reports that she voided at 1305 and then just recently got up to void at 1820. She states that when she got to void she could feel the urge to void. She states that she was unable to get to the bathroom on time and voided some in her pad and 300cc in collection hat. She states she feels like she is emptying her bladder but she cannot feel when she is voiding. Encouraged pt to void every 2-3 hours to avoid voiding in her pad.

## 2016-04-03 NOTE — Plan of Care (Signed)
Problem: Life Cycle: Goal: Risk for postpartum hemorrhage will decrease Outcome: Progressing See progress note concerning pt bleeding throughout shift

## 2016-04-03 NOTE — Progress Notes (Signed)
Post Partum Day #1 Subjective: no complaints, up ad lib and tolerating PO; passed a fist sized clot early in the morning with nl lochia since  Objective: Blood pressure 116/70, pulse 79, temperature 98.3 F (36.8 C), temperature source Oral, resp. rate 18, height 5\' 2"  (1.575 m), weight 66.2 kg (146 lb), last menstrual period 07/01/2015, SpO2 97 %, unknown if currently breastfeeding.  Physical Exam:  General: alert, cooperative and no distress Lochia: appropriate Uterine Fundus: firm DVT Evaluation: No evidence of DVT seen on physical exam.   Recent Labs  04/02/16 0035 04/03/16 0603  HGB 14.2 10.7*  HCT 40.4 30.7*    Assessment/Plan: Plan for discharge tomorrow   LOS: 1 day   Shawnise Peterkin CNM 04/03/2016, 8:05 AM

## 2016-04-03 NOTE — Progress Notes (Signed)
Pt states she can feel the urge to void. She states that she cannot feel when she is voiding. Pt voided 400cc and bladder scanner shows a 200cc post residual amount, Pt up to bathroom and voided 150cc. Will continue to monitor.

## 2016-04-03 NOTE — Lactation Note (Signed)
This note was copied from a baby's chart. Lactation Consultation Note New mom has perky round breast, short shaft compressible small nipple. Hand expression taught w/flowing colostrum. Hand expressed 5ml colostrum after attempting to latch baby and just held in mouth. Baby hasn't had interest in BF. Had colostrum around mouth. Spoon fed 5 ml colostrum. Encouraged parents to hand express and spoon feed if baby will not suckle at breast for next feeding. Mom has hand pump, gave shells to wear in bra. LC feels that when breast start filling, nipples will be difficult to latch d/t short shaft. Encouraged mom to roll in finger tips prior to latching.  Baby has significant recessed chin. Instructed and demonstrated chin tug, cheeks to breast.  Education of newborn behavior, cluster feeding, supply and demand, feeding habits. Encouraged STS, I&O,  Mom encouraged to feed baby 8-12 times/24 hours and with feeding cues. WH/LC brochure given w/resources, support groups and LC services. Patient Name: Boy Joellyn QuailsKayla Degnan WUJWJ'XToday's Date: 04/03/2016 Reason for consult: Initial assessment   Maternal Data Has patient been taught Hand Expression?: Yes Does the patient have breastfeeding experience prior to this delivery?: No  Feeding Feeding Type: Breast Milk Length of feed: 0 min  LATCH Score/Interventions Latch: Too sleepy or reluctant, no latch achieved, no sucking elicited. Intervention(s): Skin to skin;Teach feeding cues;Waking techniques Intervention(s): Adjust position;Assist with latch;Breast massage;Breast compression  Audible Swallowing: None Intervention(s): Skin to skin;Hand expression  Type of Nipple: Everted at rest and after stimulation  Comfort (Breast/Nipple): Soft / non-tender     Hold (Positioning): Assistance needed to correctly position infant at breast and maintain latch. Intervention(s): Breastfeeding basics reviewed;Support Pillows;Position options;Skin to skin  LATCH Score:  5  Lactation Tools Discussed/Used Tools: Pump;Shells Shell Type: Inverted Breast pump type: Manual Pump Review: Setup, frequency, and cleaning;Milk Storage Initiated by:: RN Date initiated:: 04/02/16   Consult Status Consult Status: Follow-up Date: 04/03/16 Follow-up type: In-patient    Charyl DancerCARVER, Maxon Kresse G 04/03/2016, 12:50 AM

## 2016-04-04 DIAGNOSIS — Z3A39 39 weeks gestation of pregnancy: Secondary | ICD-10-CM

## 2016-04-04 LAB — CBC
HCT: 31.8 % — ABNORMAL LOW (ref 36.0–46.0)
Hemoglobin: 11.1 g/dL — ABNORMAL LOW (ref 12.0–15.0)
MCH: 32.8 pg (ref 26.0–34.0)
MCHC: 34.9 g/dL (ref 30.0–36.0)
MCV: 94.1 fL (ref 78.0–100.0)
PLATELETS: 243 10*3/uL (ref 150–400)
RBC: 3.38 MIL/uL — ABNORMAL LOW (ref 3.87–5.11)
RDW: 12.9 % (ref 11.5–15.5)
WBC: 10.1 10*3/uL (ref 4.0–10.5)

## 2016-04-04 MED ORDER — SENNOSIDES-DOCUSATE SODIUM 8.6-50 MG PO TABS: 2.0000 | ORAL_TABLET | ORAL | 0 refills | Status: DC

## 2016-04-04 MED ORDER — IBUPROFEN 600 MG PO TABS: 600.0000 mg | ORAL_TABLET | Freq: Four times a day (QID) | ORAL | 0 refills | Status: DC

## 2016-04-04 MED ORDER — ACETAMINOPHEN 325 MG PO TABS: 650.0000 mg | ORAL_TABLET | ORAL | 0 refills | Status: DC | PRN

## 2016-04-04 NOTE — Lactation Note (Signed)
This note was copied from a baby's chart. Lactation Consultation Note; Now under double phototherapy. Mom attempting to latch baby as I went into room. Reports he last fed at 2:15 am.  Attempted to latch baby without NS. He latched, nursed for 5 min then on and off the breast. Tried with NS continues on and off the breast. Mom easily able to hand express Colostrum, Spoon fed about 5 ml. Encouraged mom to feed him at least every 3 hours and with feeding cues. Encouraged to use DEBP after feedings to promote milk supply. No questions at present. To call for assist prn  Patient Name: Sara Joellyn QuailsKayla Oliphant IONGE'XToday's Date: 04/04/2016 Reason for consult: Follow-up assessment;Hyperbilirubinemia   Maternal Data Formula Feeding for Exclusion: No Has patient been taught Hand Expression?: Yes Does the patient have breastfeeding experience prior to this delivery?: No  Feeding Feeding Type: Breast Fed Length of feed: 20 min (on and off)  LATCH Score/Interventions Latch: Repeated attempts needed to sustain latch, nipple held in mouth throughout feeding, stimulation needed to elicit sucking reflex.  Audible Swallowing: A few with stimulation  Type of Nipple: Everted at rest and after stimulation Intervention(s): Double electric pump  Comfort (Breast/Nipple): Soft / non-tender     Hold (Positioning): Assistance needed to correctly position infant at breast and maintain latch.  LATCH Score: 7  Lactation Tools Discussed/Used Tools: Nipple Shields Nipple shield size: 16 Breast pump type: Double-Electric Breast Pump   Consult Status Consult Status: Follow-up Date: 04/05/16 Follow-up type: In-patient    Pamelia HoitWeeks, Jayceion Lisenby D 04/04/2016, 11:12 AM

## 2016-04-04 NOTE — Discharge Instructions (Signed)

## 2016-04-04 NOTE — Progress Notes (Addendum)
I have been encouraging pt to void every few hours, she has been doing so and states the urge to void is improving and can feel that she has emptied her bladder. Pt states that each void has been 100-200 ml.  At approximately 0510 pt had another episode of sudden urgence, with mild incontinence

## 2016-04-04 NOTE — Discharge Summary (Signed)
OB Discharge Summary     Patient Name: Sara [name] DOB: [birth year] MRN: [id number]  Date of admission: 04/01/2016 Delivering MD: Gorden Harms C   Date of discharge: 04/04/2016  Admitting diagnosis: 39w ctx about 5 min apart, might be leaking fluid Intrauterine pregnancy: [redacted]w[redacted]d     Secondary diagnosis:  Principal Problem:   NSVD (normal spontaneous vaginal delivery) Active Problems:   Pregnancy  Additional problems: Postpartum bleeding     Discharge diagnosis: Term Pregnancy Delivered, Preeclampsia (mild) and Anemia                                                                                                Post partum procedures:None  Augmentation: None  Complications: Intrauterine Inflammation or infection (Chorioamniotis)  Hospital course:  Onset of Labor With Vaginal Delivery     21 y.o. yo G1P1001 at [redacted]w[redacted]d was admitted in Latent Labor on 04/01/2016. Patient had an uncomplicated labor course as follows:  Membrane Rupture Time/Date: 8:00 AM ,03/31/2016   Intrapartum Procedures: Episiotomy: None [1]                                         Lacerations:  1st degree [2];Perineal [11]  Patient had a delivery of a Viable infant. 04/02/2016  Information for the patient's newborn:  Jamecia, Lerman [[id number]]       Pateint had an uncomplicated postpartum course.  She is ambulating, tolerating a regular diet, passing flatus, and urinating well. Patient is discharged home in stable condition on 04/04/16.   Physical exam  Vitals:   04/03/16 0500 04/03/16 0609 04/03/16 1825 04/04/16 0549  BP: 139/71 116/70 116/71 109/61  Pulse: 83 79 94 73  Resp:  18 18 18   Temp: 98.4 F (36.9 C) 98.3 F (36.8 C) 97.7 F (36.5 C) 98 F (36.7 C)  TempSrc:  Oral Oral Oral  SpO2:      Weight:      Height:       General: alert, cooperative and no distress Lochia: appropriate Uterine Fundus: firm Incision: N/A DVT Evaluation: No evidence of DVT seen on physical exam. No  significant calf/ankle edema. Labs: Lab Results  Component Value Date   WBC 12.2 (H) 04/03/2016   HGB 10.7 (L) 04/03/2016   HCT 30.7 (L) 04/03/2016   MCV 93.9 04/03/2016   PLT 206 04/03/2016   CMP Latest Ref Rng & Units 04/02/2016  Glucose 65 - 99 mg/dL 213(Y)  BUN 6 - 20 mg/dL 8  Creatinine 8.65 - 7.84 mg/dL 6.96  Sodium 295 - 284 mmol/L 136  Potassium 3.5 - 5.1 mmol/L 3.7  Chloride 101 - 111 mmol/L 103  CO2 22 - 32 mmol/L 21(L)  Calcium 8.9 - 10.3 mg/dL 9.4  Total Protein 6.5 - 8.1 g/dL 6.7  Total Bilirubin 0.3 - 1.2 mg/dL 1.2  Alkaline Phos 38 - 126 U/L 151(H)  AST 15 - 41 U/L 21  ALT 14 - 54 U/L 10(L)    Discharge instruction: per After Visit Summary  and "Baby and Me Booklet".  After visit meds:  Allergies as of 04/04/2016   No Known Allergies     Medication List    TAKE these medications   acetaminophen 325 MG tablet Commonly known as:  TYLENOL Take 2 tablets (650 mg total) by mouth every 4 (four) hours as needed (for pain scale < 4).   ibuprofen 600 MG tablet Commonly known as:  ADVIL,MOTRIN Take 1 tablet (600 mg total) by mouth every 6 (six) hours.   prenatal multivitamin Tabs tablet Take 1 tablet by mouth at bedtime.   senna-docusate 8.6-50 MG tablet Commonly known as:  Senokot-S Take 2 tablets by mouth daily. Start taking on:  04/05/2016      Postpartum Bleeding:  - Hemoglobin dowtrended to 10.7 from 14.2. Hemoglobin rechecked on day of discharge. Patient asymptomatic - She received methergine x 3 doses prior to discharge - Continue to monitor  Pre-eclampsia:  - Patient's blood pressures well-controlled post partum. No anti-hypertensives were continued  Diet: routine diet  Activity: Advance as tolerated. Pelvic rest for 6 weeks.   Outpatient follow up:6 weeks Follow up Appt:No future appointments. Follow up Visit:No Follow-up on file.  Postpartum contraception: Depo Provera  Newborn Data: Live born female  Birth Weight: 7 lb 4.8 oz (3310  g) APGAR: 9, 9  Baby Feeding: Breast Disposition:home with mother   04/04/2016 Gorden HarmsMegan Campbell, MD  I was present for the exam and agree with above. Pt reports starting leak urine on her way to the bathroom. No dysuria, urgency, frequency. Discussed variations in resuming pelvic floor tone. Recommend Kagels #10 TID and F/U in office 1 week for reeval and BP check.   [place] Stepanie Graver, CNM 04/04/2016 4:42 PM

## 2016-04-05 ENCOUNTER — Ambulatory Visit: Payer: Self-pay

## 2016-04-05 NOTE — Lactation Note (Addendum)
This note was copied from a baby's chart. Lactation Consultation Note  Patient Name: Boy Joellyn QuailsKayla Arterberry UEAVW'UToday's Date: 04/05/2016 Reason for consult: Follow-up assessment   With this mom of a term baby, now 6070 hours old, and having trouble latching due to an anterior tight frenulum, and high palate. The baby also has a lip frenulum that extends to the gum line. I increased mom to a 20 nipple shield, reviewed with mom how to apply with suction, and baby was latched deeply, by compressing beyond the nipple, and good breast movement seen, and a swallow every 2-3 suckles.  Mom is pumping after feeding, and I decreased her to 21 flanges with a good fit. She has red, tender nipples. I will get her coocnut oil and comfort gels, and instruct her in their use.  I showed baby's oral assessment to the parents, and also spoke to Dr. Waynette ButteryGreer, the faculty MD. Dr. Waynette ButteryGreer will speak to the parents about tongue tie.  I will also do a 12 day [employer]P loaner for mom, if they can afford the $30 loaner fee., and will also make an o/p lactatiin appointment for the parents.  Mom di loan a DEP, and has an o/p appointmetn for this Thursday, 1/26. Oral restriction rrsources given to parents also.  Maternal Data    Feeding Feeding Type: Breast Fed Length of feed: 10 min  LATCH Score/Interventions Latch: Grasps breast easily, tongue down, lips flanged, rhythmical sucking. (with 20 nipple shield, baby advance deep beyond mom's nipple) Intervention(s): Skin to skin;Teach feeding cues;Waking techniques Intervention(s): Adjust position;Assist with latch;Breast compression;Breast massage  Audible Swallowing: Spontaneous and intermittent  Type of Nipple: Everted at rest and after stimulation  Comfort (Breast/Nipple): Filling, red/small blisters or bruises, mild/mod discomfort  Problem noted: Mild/Moderate discomfort  Hold (Positioning): Assistance needed to correctly position infant at breast and maintain  latch. Intervention(s): Breastfeeding basics reviewed;Support Pillows;Position options;Skin to skin  LATCH Score: 8  Lactation Tools Discussed/Used Nipple shield size: 20 Breast pump type: Double-Electric Breast Pump [place]IC [place]   Consult Status Consult Status: Follow-up Follow-up type: Out-patient    Alfred LevinsLee, Terryn Rosenkranz Anne 04/05/2016, 9:11 AM

## 2016-04-10 ENCOUNTER — Ambulatory Visit: Payer: Self-pay

## 2016-04-10 NOTE — Lactation Note (Signed)
This note was copied from a baby's chart. Lactation Consult - 8 Day old Baby Boy Sara [name] and mom Sara [name] arrived for 4 pm LC O/P appt. . Per mom baby last fed at 2 pm and took 70 ml from a bottle. Still  Having problems latching at home. Pumping every 2-3 hours for 15 -20 mins with 70 -80 ml total with each pump, #21 flange. Milk came in at 4 days and no engorgement.  WIC Loaner from the hospital. [facility] appt. @ [place] to pick up the [facility] loaner on 1/29 . Baby  has been having large wets 4 -5 / 24 hours , and 6 stools ( some are with urine )/ yellow.  Baby was last weighed at  [facility] 1/23 7-1 oz.  See LC impression below and LC plan if care   Mother's reason for visit: Baby isn't latching  Visit Type: feeding assessment  Appointment Notes:  Term infant , Difficult latch, Anterior tongue tie, DEBP , 20 NS, Left message, 04/09/2016  Consult:  Initial Lactation Consultant:  Matilde Sprang Nance Mccombs  ________________________________________________________________________ [name] Name:  Sara [name] Date of Birth:  04/02/2015 Pediatrician:  Dr. Mayo Ao / [facility] - [facility]  Gender:  female Gestational Age: [redacted]w[redacted]d (At Birth) Birth Weight:  7 lb 4.8 oz (3310 g) Weight at Discharge:                          Date of Discharge:   There were no vitals filed for this visit. [facility] 7-1 oz , 1/23    Location:Pediatrician's office Weight today:  3270 g , 7-3.3 oz    ________________________________________________________________________  Mother's Name: Sara [name] Type of delivery:  Vaginal, Spontaneous Delivery Breastfeeding Experience:  1st baby  Maternal Medical Conditions:  None  Maternal Medications:  PNV   _____________________________________________________________________________  Maternal Breast Assessment  Breast:  Full Nipple:  Erect Pain level:  0 Pain interventions:  Expressed  breast milk  _______________________________________________________________________ Feeding Assessment/Evaluation  Initial feeding assessment:  Infant's oral assessment:  Variance LC assessed baby's oral cavity with a gloved finger and noted a high palate, short labial frenulum above the gum line, upper lip stretches well with the exam and the baby latched with NS at both breast. Short anterior frenulum and not tight , but boarder line. Limited tongue mobility.   Positioning:  Football   1st attempted to latch without the NS , on and off and unable to sustain latch.  Right breast  LATCH documentation:  Latch:  2 = Grasps breast easily, tongue down, lips flanged, rhythmical sucking.  Audible swallowing:  2 = Spontaneous and intermittent  Type of nipple:  2 = Everted at rest and after stimulation  Comfort (Breast/Nipple):  1 = Filling, red/small blisters or bruises, mild/mod discomfort  Hold (Positioning):  1 = Assistance needed to correctly position infant at breast and maintain latch  LATCH score:  8   Attached assessment:  Deep  Lips flanged:  No.  Lips untucked:  Yes.    Suck assessment:  Nutritive  Tools:  Nipple shield 20 mm Instructed on use and cleaning of tool:  Yes.    Pre-feed weight:  3270 g , 7-3.3 oz  Post-feed weight:  3316 g , 7-4.9 oz  Amount transferred:  46 ml  Amount supplemented: none needed   Additional Feeding Assessment -   Infant's oral assessment:  Variance - see above note   Positioning:  Cross cradle  Right breast   LATCH documentation:  Latch:  2 = Grasps breast easily, tongue down, lips flanged, rhythmical sucking.  Audible swallowing:  2 = Spontaneous and intermittent  Type of nipple:  2 = Everted at rest and after stimulation  Comfort (Breast/Nipple):  1 = Filling, red/small blisters or bruises, mild/mod discomfort  Hold (Positioning):  1 = Assistance needed to correctly position infant at breast and maintain latch  LATCH score:  8    Attached assessment:  Deep  Lips flanged:  Yes.    Lips untucked:  Yes.    Suck assessment:  Nutritive  Tools:  Nipple shield 20 mm and hand pump for pre - pump at home to make the nipple more elastic to help the NS fit better  Instructed on use and cleaning of tool:  Yes.     Yellow stool changed   Pre-feed weight: 3310 g ,7-4.8 oz ( wet changed ) - re -weight again  Pre- feed weight: 3304 g , 7-4.5 oz  Post-feed weight:  3316 g , 7-5.0 oz  Amount transferred: 12 ml  Amount supplemented: none needed   Left breast / football / latched with LS 9 ( mom comfortable ) and fed 10 mins and released , nipple well rounded , fed with the #20 NS  Pre- feed weight: 3316 g , 7-5.0 oz  Post - feed weight: 3328 g , 7-5.4 oz  Amount transferred: 12 ml    Total amount pumped post feed: no need Total amount transferred:  70 ml  Total supplement given:  None needed   Lactation Impression:  Even though mom has had challenges while in the hospital and at home with latching  She has been very motivated to be consistent with pumping consistently around the clock every 2-3 hours.  Baby is 1 oz away from birth weight and per mom takes 70 -80 ml from a bottle and tolerates well.  @ this consult LC assisted and reviewed application of the #20 NS and baby latched 3 x's with depth . And transferred off 70 ml ( excellent for 8 day old)   LC concern is the short labial frenulum making it difficult for the baby to latch unless the baby has a nipple shield - Can't sustain latch otherwise  Due to the decrease mobility of the baby's tongue. LC feels this baby's tongue mobility needs to be assessed by an oral specialist whom specializes in tongue - tie issues.  Mom is very motivated to breast feed since the baby did latch well with a NS .  LC feels is the tongue mobility issue was addressed the baby probably would beable to latch with out the NS and then mom wouldn't have to continue to have to pump 5-6 times a  day If she desires to have longevity with breast feeding. Mom has worked very hard to establish and maintain her milk supply.  [facility] ( discussed with mom and expressed feeling good about the Plan.   Lactation Plan of Care Praised mom for her efforts breast feeding.  LC recommended having a discussion with Pedis on 1/30 @ baby's appt. Breast feeding goals - protect establishing milk supply and maintain, Feed the baby at the breast majority of the feedings and at least one bottle a day. ( see options)  OPtion #1 - breast feed with #20 NS 1st breast - soften well . ( if to  full express with hand expressing or pre pump - so NS fits better )  Offer 2nd breast . Option #2 - breast feed 1st breast 15 -20 mins - soften well and 2nd part of feeding with a bottle 45 - 60 ml ( pace feed )  Option #3 - pump both breast 15 - 20 mins - soften well , and feed the baby a bottle 60 ml - 90 ml  Extra pumping - need to release down at least 10 mins after 5-6 feedings and when necessary/

## 2016-05-08 ENCOUNTER — Ambulatory Visit (INDEPENDENT_AMBULATORY_CARE_PROVIDER_SITE_OTHER): Payer: Medicaid Other | Admitting: Women's Health

## 2016-05-08 ENCOUNTER — Encounter: Payer: Self-pay | Admitting: Women's Health

## 2016-05-08 DIAGNOSIS — N393 Stress incontinence (female) (male): Secondary | ICD-10-CM | POA: Insufficient documentation

## 2016-05-08 DIAGNOSIS — Z3202 Encounter for pregnancy test, result negative: Secondary | ICD-10-CM

## 2016-05-08 LAB — POCT URINE PREGNANCY: Preg Test, Ur: NEGATIVE

## 2016-05-08 MED ORDER — NORETHIN-ETH ESTRAD-FE BIPHAS 1 MG-10 MCG / 10 MCG PO TABS: 1.0000 | ORAL_TABLET | Freq: Every day | ORAL | 3 refills | Status: DC

## 2016-05-08 NOTE — Patient Instructions (Addendum)
Kegel Exercises (100 per day) The goal of Kegel exercises is to isolate and exercise your pelvic floor muscles. These muscles act as a hammock that supports the rectum, vagina, small intestine, and uterus. As the muscles weaken, the hammock sags and these organs are displaced from their normal positions. Kegel exercises can strengthen your pelvic floor muscles and help you to improve bladder and bowel control, improve sexual response, and help reduce many problems and some discomfort during pregnancy. Kegel exercises can be done anywhere and at any time. HOW TO PERFORM KEGEL EXERCISES 1. Locate your pelvic floor muscles. To do this, squeeze (contract) the muscles that you use when you try to stop the flow of urine. You will feel a tightness in the vaginal area (women) and a tight lift in the rectal area (men and women). 2. When you begin, contract your pelvic muscles tight for 2-5 seconds, then relax them for 2-5 seconds. This is one set. Do 4-5 sets with a short pause in between. 3. Contract your pelvic muscles for 8-10 seconds, then relax them for 8-10 seconds. Do 4-5 sets. If you cannot contract your pelvic muscles for 8-10 seconds, try 5-7 seconds and work your way up to 8-10 seconds. Your goal is 4-5 sets of 10 contractions each day. Keep your stomach, buttocks, and legs relaxed during the exercises. Perform sets of both short and long contractions. Vary your positions. Perform these contractions 3-4 times per day. Perform sets while you are:   Lying in bed in the morning.  Standing at lunch.  Sitting in the late afternoon.  Lying in bed at night. You should do 40-50 contractions per day. Do not perform more Kegel exercises per day than recommended. Overexercising can cause muscle fatigue. Continue these exercises for for at least 15-20 weeks or as directed by your caregiver. This information is not intended to replace advice given to you by your health care provider. Make sure you discuss  any questions you have with your health care provider. Document Released: 02/18/2012 Document Revised: 03/24/2014 Document Reviewed: 01/21/2015 Elsevier Interactive Patient Education  2017 Elsevier Inc.   Oral Contraception Use Oral contraceptive pills (OCPs) are medicines taken to prevent pregnancy. OCPs work by preventing the ovaries from releasing eggs. The hormones in OCPs also cause the cervical mucus to thicken, preventing the sperm from entering the uterus. The hormones also cause the uterine lining to become thin, not allowing a fertilized egg to attach to the inside of the uterus. OCPs are highly effective when taken exactly as prescribed. However, OCPs do not prevent sexually transmitted diseases (STDs). Safe sex practices, such as using condoms along with an OCP, can help prevent STDs. Before taking OCPs, you may have a physical exam and Pap test. Your health care provider may also order blood tests if necessary. Your health care provider will make sure you are a good candidate for oral contraception. Discuss with your health care provider the possible side effects of the OCP you may be prescribed. When starting an OCP, it can take 2 to 3 months for the body to adjust to the changes in hormone levels in your body. How to take oral contraceptive pills Your health care provider may advise you on how to start taking the first cycle of OCPs. Otherwise, you can:  Start on day 1 of your menstrual period. You will not need any backup contraceptive protection with this start time.  Start on the first Sunday after your menstrual period or the day you  get your prescription. In these cases, you will need to use backup contraceptive protection for the first week.  Start the pill at any time of your cycle. If you take the pill within 5 days of the start of your period, you are protected against pregnancy right away. In this case, you will not need a backup form of birth control. If you start at any  other time of your menstrual cycle, you will need to use another form of birth control for 7 days. If your OCP is the type called a minipill, it will protect you from pregnancy after taking it for 2 days (48 hours). After you have started taking OCPs:  If you forget to take 1 pill, take it as soon as you remember. Take the next pill at the regular time.  If you miss 2 or more pills, call your health care provider because different pills have different instructions for missed doses. Use backup birth control until your next menstrual period starts.  If you use a 28-day pack that contains inactive pills and you miss 1 of the last 7 pills (pills with no hormones), it will not matter. Throw away the rest of the non-hormone pills and start a new pill pack. No matter which day you start the OCP, you will always start a new pack on that same day of the week. Have an extra pack of OCPs and a backup contraceptive method available in case you miss some pills or lose your OCP pack. Follow these instructions at home:  Do not smoke.  Always use a condom to protect against STDs. OCPs do not protect against STDs.  Use a calendar to mark your menstrual period days.  Read the information and directions that came with your OCP. Talk to your health care provider if you have questions. Contact a health care provider if:  You develop nausea and vomiting.  You have abnormal vaginal discharge or bleeding.  You develop a rash.  You miss your menstrual period.  You are losing your hair.  You need treatment for mood swings or depression.  You get dizzy when taking the OCP.  You develop acne from taking the OCP.  You become pregnant. Get help right away if:  You develop chest pain.  You develop shortness of breath.  You have an uncontrolled or severe headache.  You develop numbness or slurred speech.  You develop visual problems.  You develop pain, redness, and swelling in the legs. This  information is not intended to replace advice given to you by your health care provider. Make sure you discuss any questions you have with your health care provider. Document Released: 02/20/2011 Document Revised: 08/09/2015 Document Reviewed: 08/22/2012 Elsevier Interactive Patient Education  2017 Elsevier Inc.  For Headaches:   Stay well hydrated, drink enough water so that your urine is clear, sometimes if you are dehydrated you can get headaches  Eat small frequent meals and snacks, sometimes if you are hungry you can get headaches  Sometimes you get headaches during pregnancy from the pregnancy hormones  You can try tylenol (1-2 regular strength 325mg  or 1-2 extra strength 500mg ) as directed on the box. The least amount of medication that works is best.   Cool compresses (cool wet washcloth or ice pack) to area of head that is hurting  You can also try drinking a caffeinated drink to see if this will help  If not helping, try below:  For Prevention of Headaches/Migraines:  CoQ10 100mg  three  times daily  Vitamin B2 400mg  daily  Magnesium Oxide 400-600mg  daily  If You Get a Bad Headache/Migraine:  Benadryl 25mg    Magnesium Oxide  1 large Gatorade  2 extra strength Tylenol (1,000mg  total)  1 cup coffee or Coke  If this doesn't help please call us @ [phone]

## 2016-05-08 NOTE — Progress Notes (Signed)
Subjective:    Sara [name] is a 21 y.o. [id number] Caucasian female who presents for a postpartum visit. She is 4 weeks postpartum following a spontaneous vaginal delivery at 39.3 gestational weeks, had pre-e w/o severe features intrapartum. Anesthesia: epidural. I have fully reviewed the prenatal and intrapartum course. Postpartum course has been uncomplicated. Baby's course has been uncomplicated. Baby is feeding by breast now bottle. Bleeding no bleeding. Bowel function is normal. Bladder function is stress urinary incontinence since birth of baby- right after birth would just randomly leak, now just w/ coughing/sneezing/etc- has been doing some kegels when she remembers. Patient is not sexually active. Last sexual activity: prior to birth of baby. Contraception method is wants to start pills. Does not smoke, no h/o HTN, DVT/PE, CVA, MI, or migraines w/ aura. Postpartum depression screening: equivocal. Score 10. Too busy to eat, sleeps well when baby is sleeping, still finds joy in things she used to, denies SI/HI/II. Discussed expectant management vs. therapy vs. meds (dont' feel like she needs meds at this time)- declines therapy.  Last pap <21yo.  The following portions of the patient's history were reviewed and updated as appropriate: allergies, current medications, past medical history, past surgical history and problem list.  Review of Systems Pertinent items are noted in HPI.   Vitals:   05/08/16 1344  BP: 122/64  Pulse: 76  Weight: 133 lb (60.3 kg)  Height: 5\' 2"  (1.575 m)   Patient's last menstrual period was 07/01/2015 (exact date).  Objective:   General:  alert, cooperative and no distress   Breasts:  deferred, no complaints  Lungs: clear to auscultation bilaterally  Heart:  regular rate and rhythm  Abdomen: soft, nontender   Vulva: normal  Vagina: normal vagina  Cervix:  closed  Corpus: Well-involuted  Adnexa:  Non-palpable  Rectal Exam: No hemorrhoids        Assessment:    Postpartum exam 4 wks s/p SVB Intrapartum pre-e w/o severe features Stress UI bottlefeeding Depression screening Contraception counseling   Plan:  Contraception: rx LoLoestrin 3pk w/ 3RF Follow up in: 3 months for coc f/u, or earlier if needed Call if feels depression/mood worsening- wants therapy/meds Kegels 100/day for UI  Sara [name], Sara [name] CNM, [facility]NP[facility]BC 05/08/2016 1:48 PM

## 2016-05-19 ENCOUNTER — Telehealth: Payer: Self-pay | Admitting: *Deleted

## 2016-05-19 NOTE — Telephone Encounter (Signed)
Patient called stating she is having spotting and clots at times since starting her BCP. Informed patient that abnormal bleeding is normal when starting BCP and may take getting through at least 2 packs of pills to get it regulated. Pt verbalized understanding.

## 2016-05-29 ENCOUNTER — Ambulatory Visit (INDEPENDENT_AMBULATORY_CARE_PROVIDER_SITE_OTHER): Payer: Medicaid Other | Admitting: Pediatrics

## 2016-05-29 ENCOUNTER — Encounter: Payer: Self-pay | Admitting: Pediatrics

## 2016-05-29 VITALS — Temp 98.2°F | Wt 131.2 lb

## 2016-05-29 DIAGNOSIS — J069 Acute upper respiratory infection, unspecified: Secondary | ICD-10-CM

## 2016-05-29 NOTE — Patient Instructions (Signed)
Upper Respiratory Infection, Adult Most upper respiratory infections (URIs) are a viral infection of the air passages leading to the lungs. A URI affects the nose, throat, and upper air passages. The most common type of URI is nasopharyngitis and is typically referred to as "the common cold." URIs run their course and usually go away on their own. Most of the time, a URI does not require medical attention, but sometimes a bacterial infection in the upper airways can follow a viral infection. This is called a secondary infection. Sinus and middle ear infections are common types of secondary upper respiratory infections. Bacterial pneumonia can also complicate a URI. A URI can worsen asthma and chronic obstructive pulmonary disease (COPD). Sometimes, these complications can require emergency medical care and may be life threatening. What are the causes? Almost all URIs are caused by viruses. A virus is a type of germ and can spread from one person to another. What increases the risk? You may be at risk for a URI if:  You smoke.  You have chronic heart or lung disease.  You have a weakened defense (immune) system.  You are very young or very old.  You have nasal allergies or asthma.  You work in crowded or poorly ventilated areas.  You work in health care facilities or schools.  What are the signs or symptoms? Symptoms typically develop 2-3 days after you come in contact with a cold virus. Most viral URIs last 7-10 days. However, viral URIs from the influenza virus (flu virus) can last 14-18 days and are typically more severe. Symptoms may include:  Runny or stuffy (congested) nose.  Sneezing.  Cough.  Sore throat.  Headache.  Fatigue.  Fever.  Loss of appetite.  Pain in your forehead, behind your eyes, and over your cheekbones (sinus pain).  Muscle aches.  How is this diagnosed? Your health care provider may diagnose a URI by:  Physical exam.  Tests to check that your  symptoms are not due to another condition such as: ? Strep throat. ? Sinusitis. ? Pneumonia. ? Asthma.  How is this treated? A URI goes away on its own with time. It cannot be cured with medicines, but medicines may be prescribed or recommended to relieve symptoms. Medicines may help:  Reduce your fever.  Reduce your cough.  Relieve nasal congestion.  Follow these instructions at home:  Take medicines only as directed by your health care provider.  Gargle warm saltwater or take cough drops to comfort your throat as directed by your health care provider.  Use a warm mist humidifier or inhale steam from a shower to increase air moisture. This may make it easier to breathe.  Drink enough fluid to keep your urine clear or pale yellow.  Eat soups and other clear broths and maintain good nutrition.  Rest as needed.  Return to work when your temperature has returned to normal or as your health care provider advises. You may need to stay home longer to avoid infecting others. You can also use a face mask and careful hand washing to prevent spread of the virus.  Increase the usage of your inhaler if you have asthma.  Do not use any tobacco products, including cigarettes, chewing tobacco, or electronic cigarettes. If you need help quitting, ask your health care provider. How is this prevented? The best way to protect yourself from getting a cold is to practice good hygiene.  Avoid oral or hand contact with people with cold symptoms.  Wash your   hands often if contact occurs.  There is no clear evidence that vitamin C, vitamin E, echinacea, or exercise reduces the chance of developing a cold. However, it is always recommended to get plenty of rest, exercise, and practice good nutrition. Contact a health care provider if:  You are getting worse rather than better.  Your symptoms are not controlled by medicine.  You have chills.  You have worsening shortness of breath.  You have  brown or red mucus.  You have yellow or brown nasal discharge.  You have pain in your face, especially when you bend forward.  You have a fever.  You have swollen neck glands.  You have pain while swallowing.  You have white areas in the back of your throat. Get help right away if:  You have severe or persistent: ? Headache. ? Ear pain. ? Sinus pain. ? Chest pain.  You have chronic lung disease and any of the following: ? Wheezing. ? Prolonged cough. ? Coughing up blood. ? A change in your usual mucus.  You have a stiff neck.  You have changes in your: ? Vision. ? Hearing. ? Thinking. ? Mood. This information is not intended to replace advice given to you by your health care provider. Make sure you discuss any questions you have with your health care provider. Document Released: 08/27/2000 Document Revised: 11/04/2015 Document Reviewed: 06/08/2013 Elsevier Interactive Patient Education  2017 Elsevier Inc.  

## 2016-05-29 NOTE — Progress Notes (Signed)
Subjective:     Sara MelnickKayla F [name] is a 21 y.o. female who presents for evaluation of symptoms of a URI. Symptoms include nasal congestion, no  fever and non productive cough and headache. Her headache has improved over the past 2 days.  Onset of symptoms was 2 days ago, and has been gradually improving since that time. Treatment to date: none. The patient's boyfriend was diagnosed with the flu 4 days ago, and she states that she was last around him 5 days ago, and she wants to make sure that she does not have the flu as well.   The following portions of the patient's history were reviewed and updated as appropriate: allergies, current medications, past medical history, past social history and problem list.  Review of Systems Constitutional: negative except for fatigue Eyes: negative for irritation and redness Ears, nose, mouth, throat, and face: negative except for hoarseness and nasal congestion Respiratory: negative except for cough Gastrointestinal: negative for abdominal pain, diarrhea and vomiting   Objective:    Temp 98.2 F (36.8 C)   Wt 131 lb 4 oz (59.5 kg)   BMI 24.01 kg/m  General appearance: alert and cooperative Head: Normocephalic, without obvious abnormality Eyes: negative findings: conjunctivae and sclerae normal Ears: normal TM's and external ear canals both ears Nose: clear discharge Throat: lips, mucosa, and tongue normal; teeth and gums normal Lungs: clear to auscultation bilaterally Heart: regular rate and rhythm, S1, S2 normal, no murmur, click, rub or gallop Abdomen: soft, non-tender; bowel sounds normal; no masses,  no organomegaly   Assessment:    viral upper respiratory illness   Plan:    Discussed diagnosis and treatment of URI. Discussed the importance of avoiding unnecessary antibiotic therapy. Suggested symptomatic OTC remedies. Follow up as needed.

## 2016-05-30 ENCOUNTER — Telehealth: Payer: Self-pay | Admitting: Obstetrics and Gynecology

## 2016-05-30 NOTE — Telephone Encounter (Signed)
Patient called stating she needs a letter stating her son will be circumcised on 06/04/16 by Dr Emelda FearFerguson in order for her father's disability to pay for it. Letter will be picked up later today.

## 2016-06-25 ENCOUNTER — Telehealth: Payer: Self-pay | Admitting: Obstetrics and Gynecology

## 2016-06-30 NOTE — Telephone Encounter (Signed)
LMOM to return call if she still had questions or concerns.

## 2016-07-30 ENCOUNTER — Ambulatory Visit (INDEPENDENT_AMBULATORY_CARE_PROVIDER_SITE_OTHER): Payer: Medicaid Other | Admitting: Women's Health

## 2016-07-30 ENCOUNTER — Encounter: Payer: Self-pay | Admitting: Women's Health

## 2016-07-30 VITALS — BP 98/60 | HR 48 | Ht 62.0 in | Wt 137.5 lb

## 2016-07-30 DIAGNOSIS — Z3041 Encounter for surveillance of contraceptive pills: Secondary | ICD-10-CM | POA: Diagnosis not present

## 2016-07-30 NOTE — Progress Notes (Signed)
   [facility] Visit  Patient name: Sara [name] MRN [id number]  Date of birth: November 15, 1995  CC & HPI:  Sara MelnickKayla F Hawbaker is a 21 y.o. [id number] Caucasian female presenting today for f/u on coc's. Taking LoLoestrin w/o complications. Doesn't skip/miss pills. Has 4d period monthly, wants to know if she can skip placebo pills to stop period- this is ok.  Patient's last menstrual period was 07/29/2016. The current method of family planning is OCP (estrogen/progesterone). Last pap <21yo  Pertinent History Reviewed:  Medical & Surgical Hx:   Past medical, surgical, family, and social history reviewed in electronic medical record Medications: Reviewed & Updated - see associated section Allergies: Reviewed in electronic medical record  Objective Findings:  Vitals: BP 98/60 (BP Location: Left Arm, Patient Position: Sitting, Cuff Size: Normal)   Pulse (!) 48   Ht 5\' 2"  (1.575 m)   Wt 137 lb 8 oz (62.4 kg)   LMP 07/29/2016   Breastfeeding? No   BMI 25.15 kg/m  Body mass index is 25.15 kg/m.  Physical Examination: General appearance - alert, well appearing, and in no distress  No results found for this or any previous visit (from the past 24 hour(s)).   Assessment & Plan:  A:   Contraception management  P:  Can skip placebo pills to try to stop periods if desires  Return for after 2/22 for f/u./coc refill  Condoms always for STI prevention  Marge DuncansBooker, Sujay Grundman Randall CNM, [facility]NP[facility]BC 07/30/2016 1:49 PM

## 2016-08-07 ENCOUNTER — Ambulatory Visit: Payer: Medicaid Other | Admitting: Women's Health

## 2016-08-26 ENCOUNTER — Encounter: Payer: Self-pay | Admitting: Women's Health

## 2016-12-22 ENCOUNTER — Ambulatory Visit (INDEPENDENT_AMBULATORY_CARE_PROVIDER_SITE_OTHER): Payer: Medicaid Other | Admitting: Women's Health

## 2016-12-22 ENCOUNTER — Encounter: Payer: Self-pay | Admitting: Women's Health

## 2016-12-22 VITALS — BP 100/60 | HR 74 | Wt 140.0 lb

## 2016-12-22 DIAGNOSIS — F329 Major depressive disorder, single episode, unspecified: Secondary | ICD-10-CM | POA: Diagnosis not present

## 2016-12-22 DIAGNOSIS — R51 Headache: Secondary | ICD-10-CM | POA: Diagnosis not present

## 2016-12-22 DIAGNOSIS — F32A Depression, unspecified: Secondary | ICD-10-CM

## 2016-12-22 DIAGNOSIS — Z3009 Encounter for other general counseling and advice on contraception: Secondary | ICD-10-CM | POA: Diagnosis not present

## 2016-12-22 DIAGNOSIS — R4586 Emotional lability: Secondary | ICD-10-CM | POA: Diagnosis not present

## 2016-12-22 DIAGNOSIS — R519 Headache, unspecified: Secondary | ICD-10-CM

## 2016-12-22 NOTE — Patient Instructions (Signed)
NO SEX UNTIL AFTER YOU GET YOUR BIRTH CONTROL   Levonorgestrel intrauterine device (IUD) What is this medicine? LEVONORGESTREL IUD (LEE voe nor jes trel) is a contraceptive (birth control) device. The device is placed inside the uterus by a healthcare professional. It is used to prevent pregnancy. This device can also be used to treat heavy bleeding that occurs during your period. This medicine may be used for other purposes; ask your health care provider or pharmacist if you have questions. COMMON BRAND NAME(S): Kyleena, LILETTA, Mirena, Skyla What should I tell my health care provider before I take this medicine? They need to know if you have any of these conditions: -abnormal Pap smear -cancer of the breast, uterus, or cervix -diabetes -endometritis -genital or pelvic infection now or in the past -have more than one sexual partner or your partner has more than one partner -heart disease -history of an ectopic or tubal pregnancy -immune system problems -IUD in place -liver disease or tumor -problems with blood clots or take blood-thinners -seizures -use intravenous drugs -uterus of unusual shape -vaginal bleeding that has not been explained -an unusual or allergic reaction to levonorgestrel, other hormones, silicone, or polyethylene, medicines, foods, dyes, or preservatives -pregnant or trying to get pregnant -breast-feeding How should I use this medicine? This device is placed inside the uterus by a health care professional. Talk to your pediatrician regarding the use of this medicine in children. Special care may be needed. Overdosage: If you think you have taken too much of this medicine contact a poison control center or emergency room at once. NOTE: This medicine is only for you. Do not share this medicine with others. What if I miss a dose? This does not apply. Depending on the brand of device you have inserted, the device will need to be replaced every 3 to 5 years if you  wish to continue using this type of birth control. What may interact with this medicine? Do not take this medicine with any of the following medications: -amprenavir -bosentan -fosamprenavir This medicine may also interact with the following medications: -aprepitant -armodafinil -barbiturate medicines for inducing sleep or treating seizures -bexarotene -boceprevir -griseofulvin -medicines to treat seizures like carbamazepine, ethotoin, felbamate, oxcarbazepine, phenytoin, topiramate -modafinil -pioglitazone -rifabutin -rifampin -rifapentine -some medicines to treat HIV infection like atazanavir, efavirenz, indinavir, lopinavir, nelfinavir, tipranavir, ritonavir -St. John's wort -warfarin This list may not describe all possible interactions. Give your health care provider a list of all the medicines, herbs, non-prescription drugs, or dietary supplements you use. Also tell them if you smoke, drink alcohol, or use illegal drugs. Some items may interact with your medicine. What should I watch for while using this medicine? Visit your doctor or health care professional for regular check ups. See your doctor if you or your partner has sexual contact with others, becomes HIV positive, or gets a sexual transmitted disease. This product does not protect you against HIV infection (AIDS) or other sexually transmitted diseases. You can check the placement of the IUD yourself by reaching up to the top of your vagina with clean fingers to feel the threads. Do not pull on the threads. It is a good habit to check placement after each menstrual period. Call your doctor right away if you feel more of the IUD than just the threads or if you cannot feel the threads at all. The IUD may come out by itself. You may become pregnant if the device comes out. If you notice that the IUD has come out   use a backup birth control method like condoms and call your health care provider. Using tampons will not change the  position of the IUD and are okay to use during your period. This IUD can be safely scanned with magnetic resonance imaging (MRI) only under specific conditions. Before you have an MRI, tell your healthcare provider that you have an IUD in place, and which type of IUD you have in place. What side effects may I notice from receiving this medicine? Side effects that you should report to your doctor or health care professional as soon as possible: -allergic reactions like skin rash, itching or hives, swelling of the face, lips, or tongue -fever, flu-like symptoms -genital sores -high blood pressure -no menstrual period for 6 weeks during use -pain, swelling, warmth in the leg -pelvic pain or tenderness -severe or sudden headache -signs of pregnancy -stomach cramping -sudden shortness of breath -trouble with balance, talking, or walking -unusual vaginal bleeding, discharge -yellowing of the eyes or skin Side effects that usually do not require medical attention (report to your doctor or health care professional if they continue or are bothersome): -acne -breast pain -change in sex drive or performance -changes in weight -cramping, dizziness, or faintness while the device is being inserted -headache -irregular menstrual bleeding within first 3 to 6 months of use -nausea This list may not describe all possible side effects. Call your doctor for medical advice about side effects. You may report side effects to FDA at 1-800-FDA-1088. Where should I keep my medicine? This does not apply. NOTE: This sheet is a summary. It may not cover all possible information. If you have questions about this medicine, talk to your doctor, pharmacist, or health care provider.  2018 Elsevier/Gold Standard (2015-12-14 14:14:56)  

## 2016-12-22 NOTE — Progress Notes (Signed)
   [facility] Visit  Patient name: KIMBLEY SPRAGUE MRN [id number]  Date of birth: Jun 27, 1995 CC & HPI:  Sara [name] is a 21 y.o. G50P1001 Caucasian female being seen today for wanting to switch birth control. Has been on LoLoestrin since February, states it is causing depression, mood swings, and headaches which she is certain is all from the pills. Denies SI/HI/II. States she just feels down and has mood swings a lot. Discussed other options, she wants Mirena.  Last sex 10/7.    Patient's last menstrual period was 12/14/2016. The current method of family planning is OCP (estrogen/progesterone). Last pap <21yo. Results were:  n/a Review of Systems:   Denies any blurred vision, fatigue, shortness of breath, chest pain, abdominal pain, abnormal vaginal discharge/itching/odor/irritation, problems with periods, bowel movements, urination, or intercourse unless otherwise stated above.  Pertinent History Reviewed:  Reviewed past medical,surgical, social and family history.  Reviewed problem list, medications and allergies. Objective Findings:   Vitals:   12/22/16 1554  BP: 100/60  Pulse: 74  Weight: 140 lb (63.5 kg)    Body mass index is 25.61 kg/m.  Physical Examination: General appearance - well appearing, and in no distress Mental status - alert, oriented to person, place, and time  Depression screen PHQ 2/9 12/22/2016  Decreased Interest 2  Down, Depressed, Hopeless 2  PHQ - 2 Score 4  Altered sleeping 1  Tired, decreased energy 1  Change in appetite 0  Feeling bad or failure about yourself  1  Trouble concentrating 0  Moving slowly or fidgety/restless 0  Suicidal thoughts 0  PHQ-9 Score 7    Assessment & Plan:  1) Contraception cousenling> wants Mirena, abstinence until after placement 2) Depression/mood swings/headaches> she attributes to LoLoestrin  Return for 10/22  for , IUD insertion. (14d from last sex), doesn't want to wait until next period  Marge Duncans CNM, [facility] 12/22/2016 4:33 PM

## 2017-01-05 ENCOUNTER — Encounter: Payer: Self-pay | Admitting: Women's Health

## 2017-01-05 ENCOUNTER — Ambulatory Visit (INDEPENDENT_AMBULATORY_CARE_PROVIDER_SITE_OTHER): Payer: Medicaid Other | Admitting: Women's Health

## 2017-01-05 VITALS — BP 120/68 | HR 67 | Ht 63.0 in | Wt 141.0 lb

## 2017-01-05 DIAGNOSIS — Z30430 Encounter for insertion of intrauterine contraceptive device: Secondary | ICD-10-CM | POA: Diagnosis not present

## 2017-01-05 DIAGNOSIS — Z3202 Encounter for pregnancy test, result negative: Secondary | ICD-10-CM

## 2017-01-05 LAB — POCT URINE PREGNANCY: Preg Test, Ur: NEGATIVE

## 2017-01-05 MED ORDER — LEVONORGESTREL 20 MCG/24HR IU IUD
INTRAUTERINE_SYSTEM | Freq: Once | INTRAUTERINE | Status: AC
  Administered 2017-01-05: 16:00:00 via INTRAUTERINE

## 2017-01-05 NOTE — Patient Instructions (Signed)
 Nothing in vagina for 3 days (no sex, douching, tampons, etc...)  Check your strings once a month to make sure you can feel them, if you are not able to please let us know  If you develop a fever of 100.4 or more in the next few weeks, or if you develop severe abdominal pain, please let us know  Use a backup method of birth control, such as condoms, for 2 weeks    Intrauterine Device Insertion, Care After This sheet gives you information about how to care for yourself after your procedure. Your health care provider may also give you more specific instructions. If you have problems or questions, contact your health care provider. What can I expect after the procedure? After the procedure, it is common to have:  Cramps and pain in the abdomen.  Light bleeding (spotting) or heavier bleeding that is like your menstrual period. This may last for up to a few days.  Lower back pain.  Dizziness.  Headaches.  Nausea.  Follow these instructions at home:  Before resuming sexual activity, check to make sure that you can feel the IUD string(s). You should be able to feel the end of the string(s) below the opening of your cervix. If your IUD string is in place, you may resume sexual activity. ? If you had a hormonal IUD inserted more than 7 days after your most recent period started, you will need to use a backup method of birth control for 7 days after IUD insertion. Ask your health care provider whether this applies to you.  Continue to check that the IUD is still in place by feeling for the string(s) after every menstrual period, or once a month.  Take over-the-counter and prescription medicines only as told by your health care provider.  Do not drive or use heavy machinery while taking prescription pain medicine.  Keep all follow-up visits as told by your health care provider. This is important. Contact a health care provider if:  You have bleeding that is heavier or lasts longer than  a normal menstrual cycle.  You have a fever.  You have cramps or abdominal pain that get worse or do not get better with medicine.  You develop abdominal pain that is new or is not in the same area of earlier cramping and pain.  You feel lightheaded or weak.  You have abnormal or bad-smelling discharge from your vagina.  You have pain during sexual activity.  You have any of the following problems with your IUD string(s): ? The string bothers or hurts you or your sexual partner. ? You cannot feel the string. ? The string has gotten longer.  You can feel the IUD in your vagina.  You think you may be pregnant, or you miss your menstrual period.  You think you may have an STI (sexually transmitted infection). Get help right away if:  You have flu-like symptoms.  You have a fever and chills.  You can feel that your IUD has slipped out of place. Summary  After the procedure, it is common to have cramps and pain in the abdomen. It is also common to have light bleeding (spotting) or heavier bleeding that is like your menstrual period.  Continue to check that the IUD is still in place by feeling for the string(s) after every menstrual period, or once a month.  Keep all follow-up visits as told by your health care provider. This is important.  Contact your health care provider if   you have problems with your IUD string(s), such as the string getting longer or bothering you or your sexual partner. This information is not intended to replace advice given to you by your health care provider. Make sure you discuss any questions you have with your health care provider. Document Released: 10/30/2010 Document Revised: 01/23/2016 Document Reviewed: 01/23/2016 Elsevier Interactive Patient Education  2017 [employer]  Levonorgestrel intrauterine device (IUD) What is this medicine? LEVONORGESTREL IUD (LEE voe nor jes trel) is a contraceptive (birth control) device. The device is placed  inside the uterus by a healthcare professional. It is used to prevent pregnancy. This device can also be used to treat heavy bleeding that occurs during your period. This medicine may be used for other purposes; ask your health care provider or pharmacist if you have questions. COMMON BRAND NAME(S): Kyleena, LILETTA, Mirena, Skyla What should I tell my health care provider before I take this medicine? They need to know if you have any of these conditions: -abnormal Pap smear -cancer of the breast, uterus, or cervix -diabetes -endometritis -genital or pelvic infection now or in the past -have more than one sexual partner or your partner has more than one partner -heart disease -history of an ectopic or tubal pregnancy -immune system problems -IUD in place -liver disease or tumor -problems with blood clots or take blood-thinners -seizures -use intravenous drugs -uterus of unusual shape -vaginal bleeding that has not been explained -an unusual or allergic reaction to levonorgestrel, other hormones, silicone, or polyethylene, medicines, foods, dyes, or preservatives -pregnant or trying to get pregnant -breast-feeding How should I use this medicine? This device is placed inside the uterus by a health care professional. Talk to your pediatrician regarding the use of this medicine in children. Special care may be needed. Overdosage: If you think you have taken too much of this medicine contact a poison control center or emergency room at once. NOTE: This medicine is only for you. Do not share this medicine with others. What if I miss a dose? This does not apply. Depending on the brand of device you have inserted, the device will need to be replaced every 3 to 5 years if you wish to continue using this type of birth control. What may interact with this medicine? Do not take this medicine with any of the following medications: -amprenavir -bosentan -fosamprenavir This medicine may also  interact with the following medications: -aprepitant -armodafinil -barbiturate medicines for inducing sleep or treating seizures -bexarotene -boceprevir -griseofulvin -medicines to treat seizures like carbamazepine, ethotoin, felbamate, oxcarbazepine, phenytoin, topiramate -modafinil -pioglitazone -rifabutin -rifampin -rifapentine -some medicines to treat HIV infection like atazanavir, efavirenz, indinavir, lopinavir, nelfinavir, tipranavir, ritonavir -St. John's wort -warfarin This list may not describe all possible interactions. Give your health care provider a list of all the medicines, herbs, non-prescription drugs, or dietary supplements you use. Also tell them if you smoke, drink alcohol, or use illegal drugs. Some items may interact with your medicine. What should I watch for while using this medicine? Visit your doctor or health care professional for regular check ups. See your doctor if you or your partner has sexual contact with others, becomes HIV positive, or gets a sexual transmitted disease. This product does not protect you against HIV infection (AIDS) or other sexually transmitted diseases. You can check the placement of the IUD yourself by reaching up to the top of your vagina with clean fingers to feel the threads. Do not pull on the threads. It is a good habit   to check placement after each menstrual period. Call your doctor right away if you feel more of the IUD than just the threads or if you cannot feel the threads at all. The IUD may come out by itself. You may become pregnant if the device comes out. If you notice that the IUD has come out use a backup birth control method like condoms and call your health care provider. Using tampons will not change the position of the IUD and are okay to use during your period. This IUD can be safely scanned with magnetic resonance imaging (MRI) only under specific conditions. Before you have an MRI, tell your healthcare provider that  you have an IUD in place, and which type of IUD you have in place. What side effects may I notice from receiving this medicine? Side effects that you should report to your doctor or health care professional as soon as possible: -allergic reactions like skin rash, itching or hives, swelling of the face, lips, or tongue -fever, flu-like symptoms -genital sores -high blood pressure -no menstrual period for 6 weeks during use -pain, swelling, warmth in the leg -pelvic pain or tenderness -severe or sudden headache -signs of pregnancy -stomach cramping -sudden shortness of breath -trouble with balance, talking, or walking -unusual vaginal bleeding, discharge -yellowing of the eyes or skin Side effects that usually do not require medical attention (report to your doctor or health care professional if they continue or are bothersome): -acne -breast pain -change in sex drive or performance -changes in weight -cramping, dizziness, or faintness while the device is being inserted -headache -irregular menstrual bleeding within first 3 to 6 months of use -nausea This list may not describe all possible side effects. Call your doctor for medical advice about side effects. You may report side effects to FDA at 1-800-FDA-1088. Where should I keep my medicine? This does not apply. NOTE: This sheet is a summary. It may not cover all possible information. If you have questions about this medicine, talk to your doctor, pharmacist, or health care provider.  2018 Elsevier/Gold Standard (2015-12-14 14:14:56)   

## 2017-01-05 NOTE — Progress Notes (Signed)
   IUD INSERTION Patient name: Sara [name] MRN [id number]  Date of birth: 01/17/1996 Subjective Findings:   Sara MelnickKayla F Stiner is a 21 y.o. 51P1001 Caucasian female being seen today for insertion of a Mirena IUD.   Patient's last menstrual period was 01/02/2017. Last sexual intercourse was 2wks ago, on period now Last pap <21yo. Results were:  n/a  The risks and benefits of the method and placement have been thouroughly reviewed with the patient and all questions were answered.  Specifically the patient is aware of failure rate of 03/998, expulsion of the IUD and of possible perforation.  The patient is aware of irregular bleeding due to the method and understands the incidence of irregular bleeding diminishes with time.  Signed copy of informed consent in chart.  Pertinent History Reviewed:   Reviewed past medical,surgical, social, obstetrical and family history.  Reviewed problem list, medications and allergies. Objective Findings & Procedure:   Vitals:   01/05/17 1508  BP: 120/68  Pulse: 67  Weight: 141 lb (64 kg)  Height: 5\' 3"  (1.6 m)  Body mass index is 24.98 kg/m.  Results for orders placed or performed in visit on 01/05/17 (from the past 24 hour(s))  POCT urine pregnancy   Collection Time: 01/05/17  3:04 PM  Result Value Ref Range   Preg Test, Ur Negative Negative     Time out was performed.  A graves speculum was placed in the vagina.  The cervix was visualized, prepped using Betadine, and grasped with a single tooth tenaculum. The uterus was found to be anteroflexed and it sounded to 8 cm.  Mirena IUD placed per manufacturer's recommendations. The strings were trimmed to approximately 3 cm. The patient tolerated the procedure well.   Informal transvaginal sonogram was performed and the proper placement of the IUD was verified. Assessment & Plan:   1) Mirena IUD insertion The patient was given post procedure instructions, including signs and symptoms of infection and to  check for the strings after each menses or each month, and refraining from intercourse or anything in the vagina for 3 days. She was given a Mirena care card with date IUD placed, and date IUD to be removed. She is scheduled for a f/u appointment in 4 weeks.  Orders Placed This Encounter  Procedures  . POCT urine pregnancy    Return in about 4 weeks (around 02/02/2017) for F/U.  Marge DuncansBooker, Nashalie Sallis Randall CNM, [facility] 01/05/2017 3:50 PM

## 2017-01-05 NOTE — Addendum Note (Signed)
Addended by: Colen DarlingYOUNG, Kimmerly Lora S on: 01/05/2017 04:20 PM   Modules accepted: Orders

## 2017-02-02 ENCOUNTER — Encounter: Payer: Self-pay | Admitting: Women's Health

## 2017-02-02 ENCOUNTER — Ambulatory Visit (INDEPENDENT_AMBULATORY_CARE_PROVIDER_SITE_OTHER): Payer: Medicaid Other | Admitting: Women's Health

## 2017-02-02 VITALS — BP 100/60 | HR 98 | Wt 139.0 lb

## 2017-02-02 DIAGNOSIS — Z30431 Encounter for routine checking of intrauterine contraceptive device: Secondary | ICD-10-CM | POA: Diagnosis not present

## 2017-02-02 NOTE — Patient Instructions (Signed)

## 2017-02-02 NOTE — Progress Notes (Signed)
   GYN VISIT Patient name: Sara [name] MRN [id number]  Date of birth: 03-06-1996 Chief Complaint:   iud check  History of Present Illness:   Sara MelnickKayla F Caya is a 21 y.o. 241P1001 Caucasian female being seen today for IUD check. Mirena IUD inserted 01/05/17.   Some occ mild cramping. Sporadic bleeding since insertion. No pain or pain with sex. Checked strings the other day- was able to feel.   Patient's last menstrual period was 02/01/2017. The current method of family planning is IUD. Last pap <21yo. Results were:  n/a Review of Systems:   Pertinent items are noted in HPI Denies fever/chills, dizziness, headaches, visual disturbances, fatigue, shortness of breath, chest pain, abdominal pain, vomiting, abnormal vaginal discharge/itching/odor/irritation, problems with periods, bowel movements, urination, or intercourse unless otherwise stated above.  Pertinent History Reviewed:  Reviewed past medical,surgical, social, obstetrical and family history.  Reviewed problem list, medications and allergies. Physical Assessment:   Vitals:   02/02/17 1513  BP: 100/60  Pulse: 98  Weight: 139 lb (63 kg)  Body mass index is 24.62 kg/m.       Physical Examination:   General appearance: alert, well appearing, and in no distress  Mental status: alert, oriented to person, place, and time  Skin: warm & dry   Cardiovascular: normal heart rate noted  Respiratory: normal respiratory effort, no distress  Abdomen: soft, non-tender   Pelvic: VULVA: normal appearing vulva with no masses, tenderness or lesions, VAGINA: normal appearing vagina- on menses, CERVIX: normal appearing cervix without discharge or lesions, IUD strings visible  Extremities: no edema   No results found for this or any previous visit (from the past 24 hour(s)).  Assessment & Plan:  1) Mirena IUD check> in place  2) Sporadic bleeding w/ IUD> discussed can be normal x 3-456mths  No orders of the defined types were placed in this  encounter.   Return for 22yo for , Pap & physical.  Marge DuncansBooker, Angelos Wasco Randall CNM, [facility] 02/02/2017 3:45 PM

## 2017-02-08 ENCOUNTER — Encounter: Payer: Self-pay | Admitting: Pediatrics

## 2017-02-16 ENCOUNTER — Ambulatory Visit (INDEPENDENT_AMBULATORY_CARE_PROVIDER_SITE_OTHER): Payer: Medicaid Other | Admitting: Women's Health

## 2017-02-16 ENCOUNTER — Other Ambulatory Visit: Payer: Self-pay

## 2017-02-16 ENCOUNTER — Encounter: Payer: Self-pay | Admitting: Women's Health

## 2017-02-16 VITALS — BP 126/82 | HR 95 | Ht 62.0 in | Wt 132.0 lb

## 2017-02-16 DIAGNOSIS — F418 Other specified anxiety disorders: Secondary | ICD-10-CM | POA: Insufficient documentation

## 2017-02-16 DIAGNOSIS — Z30431 Encounter for routine checking of intrauterine contraceptive device: Secondary | ICD-10-CM | POA: Diagnosis not present

## 2017-02-16 MED ORDER — SERTRALINE HCL 25 MG PO TABS: 25.0000 mg | ORAL_TABLET | Freq: Every day | ORAL | 11 refills | Status: DC

## 2017-02-16 NOTE — Patient Instructions (Signed)
Major Depressive Disorder, Adult Major depressive disorder (MDD) is a mental health condition. It may also be called clinical depression or unipolar depression. MDD usually causes feelings of sadness, hopelessness, or helplessness. MDD can also cause physical symptoms. It can interfere with work, school, relationships, and other everyday activities. MDD may be mild, moderate, or severe. It may occur once (single episode major depressive disorder) or it may occur multiple times (recurrent major depressive disorder). What are the causes? The exact cause of this condition is not known. MDD is most likely caused by a combination of things, which may include:  Genetic factors. These are traits that are passed along from parent to child.  Individual factors. Your personality, your behavior, and the way you handle your thoughts and feelings may contribute to MDD. This includes personality traits and behaviors learned from others.  Physical factors, such as: ? Differences in the part of your brain that controls emotion. This part of your brain may be different than it is in people who do not have MDD. ? Long-term (chronic) medical or psychiatric illnesses.  Social factors. Traumatic experiences or major life changes may play a role in the development of MDD.  What increases the risk? This condition is more likely to develop in women. The following factors may also make you more likely to develop MDD:  A family history of depression.  Troubled family relationships.  Abnormally low levels of certain brain chemicals.  Traumatic events in childhood, especially abuse or the loss of a parent.  Being under a lot of stress, or long-term stress, especially from upsetting life experiences or losses.  A history of: ? Chronic physical illness. ? Other mental health disorders. ? Substance abuse.  Poor living conditions.  Experiencing social exclusion or discrimination on a regular basis.  What are  the signs or symptoms? The main symptoms of MDD typically include:  Constant depressed or irritable mood.  Loss of interest in things and activities.  MDD symptoms may also include:  Sleeping or eating too much or too little.  Unexplained weight change.  Fatigue or low energy.  Feelings of worthlessness or guilt.  Difficulty thinking clearly or making decisions.  Thoughts of suicide or of harming others.  Physical agitation or weakness.  Isolation.  Severe cases of MDD may also occur with other symptoms, such as:  Delusions or hallucinations, in which you imagine things that are not real (psychotic depression).  Low-level depression that lasts at least a year (chronic depression or persistent depressive disorder).  Extreme sadness and hopelessness (melancholic depression).  Trouble speaking and moving (catatonic depression).  How is this diagnosed? This condition may be diagnosed based on:  Your symptoms.  Your medical history, including your mental health history. This may involve tests to evaluate your mental health. You may be asked questions about your lifestyle, including any drug and alcohol use, and how long you have had symptoms of MDD.  A physical exam.  Blood tests to rule out other conditions.  You must have a depressed mood and at least four other MDD symptoms most of the day, nearly every day in the same 2-week timeframe before your health care provider can confirm a diagnosis of MDD. How is this treated? This condition is usually treated by mental health professionals, such as psychologists, psychiatrists, and clinical social workers. You may need more than one type of treatment. Treatment may include:  Psychotherapy. This is also called talk therapy or counseling. Types of psychotherapy include: ? Cognitive behavioral   therapy (CBT). This type of therapy teaches you to recognize unhealthy feelings, thoughts, and behaviors, and replace them with  positive thoughts and actions. ? Interpersonal therapy (IPT). This helps you to improve the way you relate to and communicate with others. ? Family therapy. This treatment includes members of your family.  Medicine to treat anxiety and depression, or to help you control certain emotions and behaviors.  Lifestyle changes, such as: ? Limiting alcohol and drug use. ? Exercising regularly. ? Getting plenty of sleep. ? Making healthy eating choices. ? Spending more time outdoors.  Treatments involving stimulation of the brain can be used in situations with extremely severe symptoms, or when medicine or other therapies do not work over time. These treatments include electroconvulsive therapy, transcranial magnetic stimulation, and vagal nerve stimulation. Follow these instructions at home: Activity  Return to your normal activities as told by your health care provider.  Exercise regularly and spend time outdoors as told by your health care provider. General instructions  Take over-the-counter and prescription medicines only as told by your health care provider.  Do not drink alcohol. If you drink alcohol, limit your alcohol intake to no more than 1 drink a day for nonpregnant women and 2 drinks a day for men. One drink equals 12 oz of beer, 5 oz of wine, or 1 oz of hard liquor. Alcohol can affect any antidepressant medicines you are taking. Talk to your health care provider about your alcohol use.  Eat a healthy diet and get plenty of sleep.  Find activities that you enjoy doing, and make time to do them.  Consider joining a support group. Your health care provider may be able to recommend a support group.  Keep all follow-up visits as told by your health care provider. This is important. Where to find more information: [employer] on Mental Illness  www.nami.org  [place] National Institute of Mental Health  https://carter.com/  National Suicide Prevention  Lifeline  1-800-273-TALK 202-208-2398). This is free, 24-hour help.  Contact a health care provider if:  Your symptoms get worse.  You develop new symptoms. Get help right away if:  You self-harm.  You have serious thoughts about hurting yourself or others.  You see, hear, taste, smell, or feel things that are not present (hallucinate). This information is not intended to replace advice given to you by your health care provider. Make sure you discuss any questions you have with your health care provider. Document Released: 06/28/2012 Document Revised: 11/08/2015 Document Reviewed: 09/12/2015 Elsevier Interactive Patient Education  2017 [place].   Generalized Anxiety Disorder, Adult Generalized anxiety disorder (GAD) is a mental health disorder. People with this condition constantly worry about everyday events. Unlike normal anxiety, worry related to GAD is not triggered by a specific event. These worries also do not fade or get better with time. GAD interferes with life functions, including relationships, work, and school. GAD can vary from mild to severe. People with severe GAD can have intense waves of anxiety with physical symptoms (panic attacks). What are the causes? The exact cause of GAD is not known. What increases the risk? This condition is more likely to develop in:  Women.  People who have a family history of anxiety disorders.  People who are very shy.  People who experience very stressful life events, such as the death of a loved one.  People who have a very stressful family environment.  What are the signs or symptoms? People with GAD often worry excessively about  many things in their lives, such as their health and family. They may also be overly concerned about:  Doing well at work.  Being on time.  Natural disasters.  Friendships.  Physical symptoms of GAD include:  Fatigue.  Muscle tension or having muscle twitches.  Trembling or feeling  shaky.  Being easily startled.  Feeling like your heart is pounding or racing.  Feeling out of breath or like you cannot take a deep breath.  Having trouble falling asleep or staying asleep.  Sweating.  Nausea, diarrhea, or irritable bowel syndrome (IBS).  Headaches.  Trouble concentrating or remembering facts.  Restlessness.  Irritability.  How is this diagnosed? Your health care provider can diagnose GAD based on your symptoms and medical history. You will also have a physical exam. The health care provider will ask specific questions about your symptoms, including how severe they are, when they started, and if they come and go. Your health care provider may ask you about your use of alcohol or drugs, including prescription medicines. Your health care provider may refer you to a mental health specialist for further evaluation. Your health care provider will do a thorough examination and may perform additional tests to rule out other possible causes of your symptoms. To be diagnosed with GAD, a person must have anxiety that:  Is out of his or her control.  Affects several different aspects of his or her life, such as work and relationships.  Causes distress that makes him or her unable to take part in normal activities.  Includes at least three physical symptoms of GAD, such as restlessness, fatigue, trouble concentrating, irritability, muscle tension, or sleep problems.  Before your health care provider can confirm a diagnosis of GAD, these symptoms must be present more days than they are not, and they must last for six months or longer. How is this treated? The following therapies are usually used to treat GAD:  Medicine. Antidepressant medicine is usually prescribed for long-term daily control. Antianxiety medicines may be added in severe cases, especially when panic attacks occur.  Talk therapy (psychotherapy). Certain types of talk therapy can be helpful in treating GAD  by providing support, education, and guidance. Options include: ? Cognitive behavioral therapy (CBT). People learn coping skills and techniques to ease their anxiety. They learn to identify unrealistic or negative thoughts and behaviors and to replace them with positive ones. ? Acceptance and commitment therapy (ACT). This treatment teaches people how to be mindful as a way to cope with unwanted thoughts and feelings. ? Biofeedback. This process trains you to manage your body's response (physiological response) through breathing techniques and relaxation methods. You will work with a therapist while machines are used to monitor your physical symptoms.  Stress management techniques. These include yoga, meditation, and exercise.  A mental health specialist can help determine which treatment is best for you. Some people see improvement with one type of therapy. However, other people require a combination of therapies. Follow these instructions at home:  Take over-the-counter and prescription medicines only as told by your health care provider.  Try to maintain a normal routine.  Try to anticipate stressful situations and allow extra time to manage them.  Practice any stress management or self-calming techniques as taught by your health care provider.  Do not punish yourself for setbacks or for not making progress.  Try to recognize your accomplishments, even if they are small.  Keep all follow-up visits as told by your health care provider. This is  important. Contact a health care provider if:  Your symptoms do not get better.  Your symptoms get worse.  You have signs of depression, such as: ? A persistently sad, cranky, or irritable mood. ? Loss of enjoyment in activities that used to bring you joy. ? Change in weight or eating. ? Changes in sleeping habits. ? Avoiding friends or family members. ? Loss of energy for normal tasks. ? Feelings of guilt or worthlessness. Get help right  away if:  You have serious thoughts about hurting yourself or others. If you ever feel like you may hurt yourself or others, or have thoughts about taking your own life, get help right away. You can go to your nearest emergency department or call:  Your local emergency services (911 in the [place]  A suicide crisis helpline, such as the National Suicide Prevention Lifeline at 313-079-75231-862-733-4400. This is open 24 hours a day.  Summary  Generalized anxiety disorder (GAD) is a mental health disorder that involves worry that is not triggered by a specific event.  People with GAD often worry excessively about many things in their lives, such as their health and family.  GAD may cause physical symptoms such as restlessness, trouble concentrating, sleep problems, frequent sweating, nausea, diarrhea, headaches, and trembling or muscle twitching.  A mental health specialist can help determine which treatment is best for you. Some people see improvement with one type of therapy. However, other people require a combination of therapies. This information is not intended to replace advice given to you by your health care provider. Make sure you discuss any questions you have with your health care provider. Document Released: 06/28/2012 Document Revised: 01/22/2016 Document Reviewed: 01/22/2016 Elsevier Interactive Patient Education  [employer]2018 Elsevier Inc.

## 2017-02-16 NOTE — Progress Notes (Signed)
   GYN VISIT Patient name: Sara [name] MRN [id number]  Date of birth: 09/27/1995 Chief Complaint:   IUD check  History of Present Illness:   Sara [name] is a 21 y.o. 231P1001 Caucasian female being seen today for IUD check. Reports about 1 week ago she started bleeding and cramping, so checked IUD, and strings seemed a lot longer. They feel like they are normal again now. Denies pain or dyspareunia or any other problems w/ IUD.  Mirena IUD inserted 01/05/17.  Also reports depression- had thought it was caused by COCs earlier, but now off of COCs and doesn't feel better. Has never been on meds before, however feels she would like to try. Not much of an appetite, does sleep ok, not so much joy in things she used to find joy in. Denies SI/HI/II. Saw counselor as teenager, does not want to do that again. [facility] 02/16/2017 12/22/2016  Decreased Interest 1 2  Down, Depressed, Hopeless 3 2  PHQ - 2 Score 4 4  Altered sleeping 0 1  Tired, decreased energy 2 1  Change in appetite 1 0  Feeling bad or failure about yourself  1 1  Trouble concentrating 0 0  Moving slowly or fidgety/restless 0 0  Suicidal thoughts 0 0  PHQ-9 Score 8 7  Difficult doing work/chores Somewhat difficult -     Patient's last menstrual period was 02/01/2017. The current method of family planning is IUD. Last pap <21yo. Results were:  n/a Review of Systems:   Pertinent items are noted in HPI Denies fever/chills, dizziness, headaches, visual disturbances, fatigue, shortness of breath, chest pain, abdominal pain, vomiting, abnormal vaginal discharge/itching/odor/irritation, problems with periods, bowel movements, urination, or intercourse unless otherwise stated above.  Pertinent History Reviewed:  Reviewed past medical,surgical, social, obstetrical and family history.  Reviewed problem list, medications and allergies. Physical Assessment:   Vitals:   02/16/17 1338  BP: 126/82  Pulse: 95  Weight:  132 lb (59.9 kg)  Height: 5\' 2"  (1.575 m)  Body mass index is 24.14 kg/m.       Physical Examination:   General appearance: alert, well appearing, and in no distress  Mental status: alert, oriented to person, place, and time  Skin: warm & dry   Cardiovascular: normal heart rate noted  Respiratory: normal respiratory effort, no distress  Abdomen: soft, non-tender   Pelvic: VULVA: normal appearing vulva with no masses, tenderness or lesions, VAGINA: normal appearing vagina with normal color and discharge, no lesions, CERVIX: IUD strings visible, tucked behind cx @ 2 o'clock, app length Correct fundal placement w/in endometrium confirmed via informal transvaginal bedside u/s  Extremities: no edema   No results found for this or any previous visit (from the past 24 hour(s)).  Assessment & Plan:  1) IUD check> correct position  2) Depression/anxiety> rx zoloft 25mg  daily, understands can take a few weeks to notice improvement. Declines counseling/therapy at this time. F/U 4wks  No orders of the defined types were placed in this encounter.   Return in about 4 weeks (around 03/16/2017) for F/U.  Marge DuncansBooker, [facility] Randall CNM, [facility] 02/16/2017 2:03 PM

## 2017-02-26 ENCOUNTER — Encounter: Payer: Self-pay | Admitting: Women's Health

## 2017-03-16 ENCOUNTER — Ambulatory Visit: Payer: Medicaid Other | Admitting: Women's Health

## 2018-01-13 ENCOUNTER — Encounter: Payer: Self-pay | Admitting: Pediatrics

## 2018-08-13 ENCOUNTER — Telehealth: Payer: Self-pay | Admitting: *Deleted

## 2018-08-13 NOTE — Telephone Encounter (Signed)
Patient informed we are still not allowing any visitors or children to come in during appointment time unless physical assistance is needed. Asked if has had any exposure to anyone suspected or confirmed of having COVID-19 or if she was experiencing any of the following, to reschedule: fever, cough, shortness of breath, muscle pain, diarrhea, rash, vomiting, abdominal pain, red eye, weakness, bruising, bleeding, joint pain, or a severe headache.  Stated no to all.  Asked that she complete E-check-in via mychart prior to arrival.  Advised to check-in via Hello Patient and call our office on arrival in our office parking lot to complete registration over the phone. Advised to also use the provided hand sanitizer when entering the office and to wear a mask if they have one of their own, if not, we are happy to provide one. Pt verbalized understanding.      

## 2018-08-16 ENCOUNTER — Encounter: Payer: Self-pay | Admitting: Women's Health

## 2018-08-16 ENCOUNTER — Ambulatory Visit: Payer: Medicaid Other | Admitting: Women's Health

## 2018-08-16 ENCOUNTER — Other Ambulatory Visit: Payer: Self-pay

## 2018-08-16 VITALS — BP 115/71 | HR 76 | Wt 115.0 lb

## 2018-08-16 DIAGNOSIS — Z30432 Encounter for removal of intrauterine contraceptive device: Secondary | ICD-10-CM

## 2018-08-16 NOTE — Patient Instructions (Signed)
Start prenatal vitamins today  

## 2018-08-16 NOTE — Progress Notes (Signed)
   IUD REMOVAL  Patient name: Sara [name] MRN [id number]  Date of birth: 03-02-1996 Subjective Findings:   Sara [name] is a 23 y.o. G38P1001 Caucasian female being seen today for removal of a Mirena IUD. Her IUD was placed 01/05/17.  She desires removal because of cramping, and has been talking about trying again for another pregnancy. Signed copy of informed consent in chart.   No LMP recorded. Last pap turned 22yo in Aug. Results were:  n/a The planned method of family planning is none Pertinent History Reviewed:   Reviewed past medical,surgical, social, obstetrical and family history.  Reviewed problem list, medications and allergies. Objective Findings & Procedure:    Vitals:   08/16/18 1344  BP: 115/71  Pulse: 76  Weight: 115 lb (52.2 kg)  Body mass index is 21.03 kg/m.  No results found for this or any previous visit (from the past 24 hour(s)).   Time out was performed.  A pederson speculum was placed in the vagina.  The cervix was visualized, and the strings were visible. They were grasped and the Mirena IUD was easily removed intact without complications. The patient tolerated the procedure well.  Assessment & Plan:   1) Mirena IUD removal Follow-up prn problems, start pnv today, let us know when pregnant  2) Due for pap> schedule today  No orders of the defined types were placed in this encounter.   Follow-up: Return in about 4 weeks (around 09/13/2018) for Pap & physical.  Cheral Marker CNM, [facility] 08/16/2018 2:02 PM

## 2018-09-14 ENCOUNTER — Other Ambulatory Visit: Payer: Medicaid Other | Admitting: Women's Health

## 2018-09-23 ENCOUNTER — Telehealth: Payer: Self-pay | Admitting: Adult Health

## 2018-09-23 NOTE — Telephone Encounter (Signed)

## 2018-09-24 ENCOUNTER — Other Ambulatory Visit: Payer: Medicaid Other | Admitting: Adult Health

## 2018-11-30 ENCOUNTER — Telehealth: Payer: Self-pay | Admitting: Women's Health

## 2018-11-30 NOTE — Telephone Encounter (Signed)
Called patient regarding appointment scheduled in our office encouraged to come alone to the visit if possible, however, a support person, over age 22, may accompany her  to appointment if assistance is needed for safety or care concerns. Otherwise, support persons should remain outside until the visit is complete.  ° °We ask if you have had any exposure to anyone suspected or confirmed of having COVID-19 or if you are experiencing any of the following, to call and reschedule your appointment: fever, cough, shortness of breath, muscle pain, diarrhea, rash, vomiting, abdominal pain, red eye, weakness, bruising, bleeding, joint pain, or a severe headache.  ° °Please know we will ask you these questions or similar questions when you arrive for your appointment and again it’s how we are keeping everyone safe.   ° °Also,to keep you safe, please use the provided hand sanitizer when you enter the office. We are asking everyone in the office to wear a mask to help prevent the spread of °germs. If you have a mask of your own, please wear it to your appointment, if not, we are happy to provide one for you. ° °Thank you for understanding and your cooperation.  ° ° °[facility] Staff ° ° ° °

## 2018-12-01 ENCOUNTER — Other Ambulatory Visit: Payer: Self-pay

## 2018-12-01 ENCOUNTER — Encounter: Payer: Self-pay | Admitting: Obstetrics and Gynecology

## 2018-12-01 ENCOUNTER — Ambulatory Visit: Payer: Medicaid Other | Admitting: Obstetrics and Gynecology

## 2018-12-01 VITALS — BP 139/81 | HR 112 | Ht 63.0 in | Wt 115.2 lb

## 2018-12-01 DIAGNOSIS — Z3201 Encounter for pregnancy test, result positive: Secondary | ICD-10-CM

## 2018-12-01 DIAGNOSIS — N912 Amenorrhea, unspecified: Secondary | ICD-10-CM | POA: Insufficient documentation

## 2018-12-01 LAB — POCT URINE PREGNANCY: Preg Test, Ur: POSITIVE — AB

## 2018-12-01 MED ORDER — PREPLUS 27-1 MG PO TABS: 1.0000 | ORAL_TABLET | Freq: Every day | ORAL | 13 refills | Status: DC

## 2018-12-01 NOTE — Progress Notes (Signed)
[name] presents for pregnancy confirmation Positive home UPT. LMP 10/23/18 No complaints today H/O TSVD in 03/2016 H/O depression/anxeity, no meds presently, previously took Zoloft  PE AF VSS Lungs clear  Heart RRR Abd soft + BS  A/P Amenorrhea + UPT in office today Pt instructed on diet and excersize . PNV to pharmacy OB U/S fist of Oct and then begin [facility].

## 2018-12-01 NOTE — Patient Instructions (Signed)
First Trimester of Pregnancy The first trimester of pregnancy is from week 1 until the end of week 13 (months 1 through 3). A week after a sperm fertilizes an egg, the egg will implant on the wall of the uterus. This embryo will begin to develop into a baby. Genes from you and your partner will form the baby. The female genes will determine whether the baby will be a boy or a girl. At 6-8 weeks, the eyes and face will be formed, and the heartbeat can be seen on ultrasound. At the end of 12 weeks, all the baby's organs will be formed. Now that you are pregnant, you will want to do everything you can to have a healthy baby. Two of the most important things are to get good prenatal care and to follow your health care provider's instructions. Prenatal care is all the medical care you receive before the baby's birth. This care will help prevent, find, and treat any problems during the pregnancy and childbirth. Body changes during your first trimester Your body goes through many changes during pregnancy. The changes vary from woman to woman.  You may gain or lose a couple of pounds at first.  You may feel sick to your stomach (nauseous) and you may throw up (vomit). If the vomiting is uncontrollable, call your health care provider.  You may tire easily.  You may develop headaches that can be relieved by medicines. All medicines should be approved by your health care provider.  You may urinate more often. Painful urination may mean you have a bladder infection.  You may develop heartburn as a result of your pregnancy.  You may develop constipation because certain hormones are causing the muscles that push stool through your intestines to slow down.  You may develop hemorrhoids or swollen veins (varicose veins).  Your breasts may begin to grow larger and become tender. Your nipples may stick out more, and the tissue that surrounds them (areola) may become darker.  Your gums may bleed and may be  sensitive to brushing and flossing.  Dark spots or blotches (chloasma, mask of pregnancy) may develop on your face. This will likely fade after the baby is born.  Your menstrual periods will stop.  You may have a loss of appetite.  You may develop cravings for certain kinds of food.  You may have changes in your emotions from day to day, such as being excited to be pregnant or being concerned that something may go wrong with the pregnancy and baby.  You may have more vivid and strange dreams.  You may have changes in your hair. These can include thickening of your hair, rapid growth, and changes in texture. Some women also have hair loss during or after pregnancy, or hair that feels dry or thin. Your hair will most likely return to normal after your baby is born. What to expect at prenatal visits During a routine prenatal visit:  You will be weighed to make sure you and the baby are growing normally.  Your blood pressure will be taken.  Your abdomen will be measured to track your baby's growth.  The fetal heartbeat will be listened to between weeks 10 and 14 of your pregnancy.  Test results from any previous visits will be discussed. Your health care provider may ask you:  How you are feeling.  If you are feeling the baby move.  If you have had any abnormal symptoms, such as leaking fluid, bleeding, severe headaches, or abdominal   cramping.  If you are using any tobacco products, including cigarettes, chewing tobacco, and electronic cigarettes.  If you have any questions. Other tests that may be performed during your first trimester include:  Blood tests to find your blood type and to check for the presence of any previous infections. The tests will also be used to check for low iron levels (anemia) and protein on red blood cells (Rh antibodies). Depending on your risk factors, or if you previously had diabetes during pregnancy, you may have tests to check for high blood sugar  that affects pregnant women (gestational diabetes).  Urine tests to check for infections, diabetes, or protein in the urine.  An ultrasound to confirm the proper growth and development of the baby.  Fetal screens for spinal cord problems (spina bifida) and Down syndrome.  HIV (human immunodeficiency virus) testing. Routine prenatal testing includes screening for HIV, unless you choose not to have this test.  You may need other tests to make sure you and the baby are doing well. Follow these instructions at home: Medicines  Follow your health care provider's instructions regarding medicine use. Specific medicines may be either safe or unsafe to take during pregnancy.  Take a prenatal vitamin that contains at least 600 micrograms (mcg) of folic acid.  If you develop constipation, try taking a stool softener if your health care provider approves. Eating and drinking   Eat a balanced diet that includes fresh fruits and vegetables, whole grains, good sources of protein such as meat, eggs, or tofu, and low-fat dairy. Your health care provider will help you determine the amount of weight gain that is right for you.  Avoid raw meat and uncooked cheese. These carry germs that can cause birth defects in the baby.  Eating four or five small meals rather than three large meals a day may help relieve nausea and vomiting. If you start to feel nauseous, eating a few soda crackers can be helpful. Drinking liquids between meals, instead of during meals, also seems to help ease nausea and vomiting.  Limit foods that are high in fat and processed sugars, such as fried and sweet foods.  To prevent constipation: ? Eat foods that are high in fiber, such as fresh fruits and vegetables, whole grains, and beans. ? Drink enough fluid to keep your urine clear or pale yellow. Activity  Exercise only as directed by your health care provider. Most women can continue their usual exercise routine during  pregnancy. Try to exercise for 30 minutes at least 5 days a week. Exercising will help you: ? Control your weight. ? Stay in shape. ? Be prepared for labor and delivery.  Experiencing pain or cramping in the lower abdomen or lower back is a good sign that you should stop exercising. Check with your health care provider before continuing with normal exercises.  Try to avoid standing for long periods of time. Move your legs often if you must stand in one place for a long time.  Avoid heavy lifting.  Wear low-heeled shoes and practice good posture.  You may continue to have sex unless your health care provider tells you not to. Relieving pain and discomfort  Wear a good support bra to relieve breast tenderness.  Take warm sitz baths to soothe any pain or discomfort caused by hemorrhoids. Use hemorrhoid cream if your health care provider approves.  Rest with your legs elevated if you have leg cramps or low back pain.  If you develop varicose veins in   your legs, wear support hose. Elevate your feet for 15 minutes, 3-4 times a day. Limit salt in your diet. Prenatal care  Schedule your prenatal visits by the twelfth week of pregnancy. They are usually scheduled monthly at first, then more often in the last 2 months before delivery.  Write down your questions. Take them to your prenatal visits.  Keep all your prenatal visits as told by your health care provider. This is important. Safety  Wear your seat belt at all times when driving.  Make a list of emergency phone numbers, including numbers for family, friends, the hospital, and police and fire departments. General instructions  Ask your health care provider for a referral to a local prenatal education class. Begin classes no later than the beginning of month 6 of your pregnancy.  Ask for help if you have counseling or nutritional needs during pregnancy. Your health care provider can offer advice or refer you to specialists for help  with various needs.  Do not use hot tubs, steam rooms, or saunas.  Do not douche or use tampons or scented sanitary pads.  Do not cross your legs for long periods of time.  Avoid cat litter boxes and soil used by cats. These carry germs that can cause birth defects in the baby and possibly loss of the fetus by miscarriage or stillbirth.  Avoid all smoking, herbs, alcohol, and medicines not prescribed by your health care provider. Chemicals in these products affect the formation and growth of the baby.  Do not use any products that contain nicotine or tobacco, such as cigarettes and e-cigarettes. If you need help quitting, ask your health care provider. You may receive counseling support and other resources to help you quit.  Schedule a dentist appointment. At home, brush your teeth with a soft toothbrush and be gentle when you floss. Contact a health care provider if:  You have dizziness.  You have mild pelvic cramps, pelvic pressure, or nagging pain in the abdominal area.  You have persistent nausea, vomiting, or diarrhea.  You have a bad smelling vaginal discharge.  You have pain when you urinate.  You notice increased swelling in your face, hands, legs, or ankles.  You are exposed to fifth disease or chickenpox.  You are exposed to German measles (rubella) and have never had it. Get help right away if:  You have a fever.  You are leaking fluid from your vagina.  You have spotting or bleeding from your vagina.  You have severe abdominal cramping or pain.  You have rapid weight gain or loss.  You vomit blood or material that looks like coffee grounds.  You develop a severe headache.  You have shortness of breath.  You have any kind of trauma, such as from a fall or a car accident. Summary  The first trimester of pregnancy is from week 1 until the end of week 13 (months 1 through 3).  Your body goes through many changes during pregnancy. The changes vary from  woman to woman.  You will have routine prenatal visits. During those visits, your health care provider will examine you, discuss any test results you may have, and talk with you about how you are feeling. This information is not intended to replace advice given to you by your health care provider. Make sure you discuss any questions you have with your health care provider. Document Released: 02/25/2001 Document Revised: 02/13/2017 Document Reviewed: 02/13/2016 Elsevier Patient Education  2020 [employer]  

## 2018-12-15 ENCOUNTER — Other Ambulatory Visit: Payer: Self-pay | Admitting: Obstetrics & Gynecology

## 2018-12-15 DIAGNOSIS — O3680X0 Pregnancy with inconclusive fetal viability, not applicable or unspecified: Secondary | ICD-10-CM

## 2018-12-16 ENCOUNTER — Ambulatory Visit (INDEPENDENT_AMBULATORY_CARE_PROVIDER_SITE_OTHER): Payer: Medicaid Other

## 2018-12-16 ENCOUNTER — Other Ambulatory Visit: Payer: Self-pay

## 2018-12-16 DIAGNOSIS — O3680X0 Pregnancy with inconclusive fetal viability, not applicable or unspecified: Secondary | ICD-10-CM

## 2018-12-16 DIAGNOSIS — Z3A01 Less than 8 weeks gestation of pregnancy: Secondary | ICD-10-CM

## 2018-12-16 NOTE — Progress Notes (Signed)
[place] 7+5 wks,single IUP w/ys,positive fht 157 bpm,normal ovaries bilat,crl 12.42 mm

## 2019-01-19 ENCOUNTER — Other Ambulatory Visit: Payer: Self-pay | Admitting: Obstetrics & Gynecology

## 2019-01-19 DIAGNOSIS — Z3682 Encounter for antenatal screening for nuchal translucency: Secondary | ICD-10-CM

## 2019-01-20 ENCOUNTER — Ambulatory Visit (INDEPENDENT_AMBULATORY_CARE_PROVIDER_SITE_OTHER): Payer: Medicaid Other

## 2019-01-20 ENCOUNTER — Encounter: Payer: Self-pay | Admitting: Advanced Practice Midwife

## 2019-01-20 ENCOUNTER — Ambulatory Visit: Payer: Medicaid Other | Admitting: *Deleted

## 2019-01-20 ENCOUNTER — Other Ambulatory Visit (HOSPITAL_COMMUNITY)
Admission: RE | Admit: 2019-01-20 | Discharge: 2019-01-20 | Disposition: A | Discharge status: Home / Self Care | Payer: Medicaid Other | Source: Ambulatory Visit | Attending: Advanced Practice Midwife | Admitting: Advanced Practice Midwife

## 2019-01-20 ENCOUNTER — Ambulatory Visit (INDEPENDENT_AMBULATORY_CARE_PROVIDER_SITE_OTHER): Payer: Medicaid Other | Admitting: Advanced Practice Midwife

## 2019-01-20 ENCOUNTER — Other Ambulatory Visit: Payer: Self-pay

## 2019-01-20 VITALS — BP 123/68 | HR 91 | Wt 123.0 lb

## 2019-01-20 DIAGNOSIS — O0990 Supervision of high risk pregnancy, unspecified, unspecified trimester: Secondary | ICD-10-CM | POA: Insufficient documentation

## 2019-01-20 DIAGNOSIS — Z0283 Encounter for blood-alcohol and blood-drug test: Secondary | ICD-10-CM

## 2019-01-20 DIAGNOSIS — Z124 Encounter for screening for malignant neoplasm of cervix: Secondary | ICD-10-CM

## 2019-01-20 DIAGNOSIS — Z3481 Encounter for supervision of other normal pregnancy, first trimester: Secondary | ICD-10-CM

## 2019-01-20 DIAGNOSIS — Z3A12 12 weeks gestation of pregnancy: Secondary | ICD-10-CM | POA: Diagnosis not present

## 2019-01-20 DIAGNOSIS — Z23 Encounter for immunization: Secondary | ICD-10-CM | POA: Diagnosis not present

## 2019-01-20 DIAGNOSIS — Z331 Pregnant state, incidental: Secondary | ICD-10-CM

## 2019-01-20 DIAGNOSIS — Z36 Encounter for antenatal screening for chromosomal anomalies: Secondary | ICD-10-CM

## 2019-01-20 DIAGNOSIS — Z3401 Encounter for supervision of normal first pregnancy, first trimester: Secondary | ICD-10-CM

## 2019-01-20 DIAGNOSIS — Z1371 Encounter for nonprocreative screening for genetic disease carrier status: Secondary | ICD-10-CM

## 2019-01-20 DIAGNOSIS — Z130 Encounter for screening for diseases of the blood and blood-forming organs and certain disorders involving the immune mechanism: Secondary | ICD-10-CM

## 2019-01-20 DIAGNOSIS — Z3490 Encounter for supervision of normal pregnancy, unspecified, unspecified trimester: Secondary | ICD-10-CM | POA: Insufficient documentation

## 2019-01-20 DIAGNOSIS — Z1379 Encounter for other screening for genetic and chromosomal anomalies: Secondary | ICD-10-CM | POA: Diagnosis not present

## 2019-01-20 DIAGNOSIS — Z1389 Encounter for screening for other disorder: Secondary | ICD-10-CM

## 2019-01-20 DIAGNOSIS — Z3682 Encounter for antenatal screening for nuchal translucency: Secondary | ICD-10-CM

## 2019-01-20 LAB — POCT URINALYSIS DIPSTICK OB
Blood, UA: NEGATIVE
Glucose, UA: NEGATIVE
Ketones, UA: NEGATIVE
Leukocytes, UA: NEGATIVE
Nitrite, UA: NEGATIVE
POC,PROTEIN,UA: NEGATIVE

## 2019-01-20 MED ORDER — BLOOD PRESSURE MONITOR MISC: 0 refills | Status: DC

## 2019-01-20 NOTE — Progress Notes (Signed)
[place] 12+5 wks,measurements c/w dates,normal ovaries bilat,crl 63.46 mm,fhr 160 bpm,NB present,NT 1.6 mm

## 2019-01-20 NOTE — Patient Instructions (Signed)
 First Trimester of Pregnancy The first trimester of pregnancy is from week 1 until the end of week 12 (months 1 through 3). A week after a sperm fertilizes an egg, the egg will implant on the wall of the uterus. This embryo will begin to develop into a baby. Genes from you and your partner are forming the baby. The female genes determine whether the baby is a boy or a girl. At 6-8 weeks, the eyes and face are formed, and the heartbeat can be seen on ultrasound. At the end of 12 weeks, all the baby's organs are formed.  Now that you are pregnant, you will want to do everything you can to have a healthy baby. Two of the most important things are to get good prenatal care and to follow your health care provider's instructions. Prenatal care is all the medical care you receive before the baby's birth. This care will help prevent, find, and treat any problems during the pregnancy and childbirth. BODY CHANGES Your body goes through many changes during pregnancy. The changes vary from woman to woman.   You may gain or lose a couple of pounds at first.  You may feel sick to your stomach (nauseous) and throw up (vomit). If the vomiting is uncontrollable, call your health care provider.  You may tire easily.  You may develop headaches that can be relieved by medicines approved by your health care provider.  You may urinate more often. Painful urination may mean you have a bladder infection.  You may develop heartburn as a result of your pregnancy.  You may develop constipation because certain hormones are causing the muscles that push waste through your intestines to slow down.  You may develop hemorrhoids or swollen, bulging veins (varicose veins).  Your breasts may begin to grow larger and become tender. Your nipples may stick out more, and the tissue that surrounds them (areola) may become darker.  Your gums may bleed and may be sensitive to brushing and flossing.  Dark spots or blotches  (chloasma, mask of pregnancy) may develop on your face. This will likely fade after the baby is born.  Your menstrual periods will stop.  You may have a loss of appetite.  You may develop cravings for certain kinds of food.  You may have changes in your emotions from day to day, such as being excited to be pregnant or being concerned that something may go wrong with the pregnancy and baby.  You may have more vivid and strange dreams.  You may have changes in your hair. These can include thickening of your hair, rapid growth, and changes in texture. Some women also have hair loss during or after pregnancy, or hair that feels dry or thin. Your hair will most likely return to normal after your baby is born. WHAT TO EXPECT AT YOUR PRENATAL VISITS During a routine prenatal visit:  You will be weighed to make sure you and the baby are growing normally.  Your blood pressure will be taken.  Your abdomen will be measured to track your baby's growth.  The fetal heartbeat will be listened to starting around week 10 or 12 of your pregnancy.  Test results from any previous visits will be discussed. Your health care provider may ask you:  How you are feeling.  If you are feeling the baby move.  If you have had any abnormal symptoms, such as leaking fluid, bleeding, severe headaches, or abdominal cramping.  If you have any questions. Other   tests that may be performed during your first trimester include:  Blood tests to find your blood type and to check for the presence of any previous infections. They will also be used to check for low iron levels (anemia) and Rh antibodies. Later in the pregnancy, blood tests for diabetes will be done along with other tests if problems develop.  Urine tests to check for infections, diabetes, or protein in the urine.  An ultrasound to confirm the proper growth and development of the baby.  An amniocentesis to check for possible genetic problems.  Fetal  screens for spina bifida and Down syndrome.  You may need other tests to make sure you and the baby are doing well. HOME CARE INSTRUCTIONS  Medicines  Follow your health care provider's instructions regarding medicine use. Specific medicines may be either safe or unsafe to take during pregnancy.  Take your prenatal vitamins as directed.  If you develop constipation, try taking a stool softener if your health care provider approves. Diet  Eat regular, well-balanced meals. Choose a variety of foods, such as meat or vegetable-based protein, fish, milk and low-fat dairy products, vegetables, fruits, and whole grain breads and cereals. Your health care provider will help you determine the amount of weight gain that is right for you.  Avoid raw meat and uncooked cheese. These carry germs that can cause birth defects in the baby.  Eating four or five small meals rather than three large meals a day may help relieve nausea and vomiting. If you start to feel nauseous, eating a few soda crackers can be helpful. Drinking liquids between meals instead of during meals also seems to help nausea and vomiting.  If you develop constipation, eat more high-fiber foods, such as fresh vegetables or fruit and whole grains. Drink enough fluids to keep your urine clear or pale yellow. Activity and Exercise  Exercise only as directed by your health care provider. Exercising will help you:  Control your weight.  Stay in shape.  Be prepared for labor and delivery.  Experiencing pain or cramping in the lower abdomen or low back is a good sign that you should stop exercising. Check with your health care provider before continuing normal exercises.  Try to avoid standing for long periods of time. Move your legs often if you must stand in one place for a long time.  Avoid heavy lifting.  Wear low-heeled shoes, and practice good posture.  You may continue to have sex unless your health care provider directs you  otherwise. Relief of Pain or Discomfort  Wear a good support bra for breast tenderness.   Take warm sitz baths to soothe any pain or discomfort caused by hemorrhoids. Use hemorrhoid cream if your health care provider approves.   Rest with your legs elevated if you have leg cramps or low back pain.  If you develop varicose veins in your legs, wear support hose. Elevate your feet for 15 minutes, 3-4 times a day. Limit salt in your diet. Prenatal Care  Schedule your prenatal visits by the twelfth week of pregnancy. They are usually scheduled monthly at first, then more often in the last 2 months before delivery.  Write down your questions. Take them to your prenatal visits.  Keep all your prenatal visits as directed by your health care provider. Safety  Wear your seat belt at all times when driving.  Make a list of emergency phone numbers, including numbers for family, friends, the hospital, and police and fire departments. General   Tips  Ask your health care provider for a referral to a local prenatal education class. Begin classes no later than at the beginning of month 6 of your pregnancy.  Ask for help if you have counseling or nutritional needs during pregnancy. Your health care provider can offer advice or refer you to specialists for help with various needs.  Do not use hot tubs, steam rooms, or saunas.  Do not douche or use tampons or scented sanitary pads.  Do not cross your legs for long periods of time.  Avoid cat litter boxes and soil used by cats. These carry germs that can cause birth defects in the baby and possibly loss of the fetus by miscarriage or stillbirth.  Avoid all smoking, herbs, alcohol, and medicines not prescribed by your health care provider. Chemicals in these affect the formation and growth of the baby.  Schedule a dentist appointment. At home, brush your teeth with a soft toothbrush and be gentle when you floss. SEEK MEDICAL CARE IF:   You have  dizziness.  You have mild pelvic cramps, pelvic pressure, or nagging pain in the abdominal area.  You have persistent nausea, vomiting, or diarrhea.  You have a bad smelling vaginal discharge.  You have pain with urination.  You notice increased swelling in your face, hands, legs, or ankles. SEEK IMMEDIATE MEDICAL CARE IF:   You have a fever.  You are leaking fluid from your vagina.  You have spotting or bleeding from your vagina.  You have severe abdominal cramping or pain.  You have rapid weight gain or loss.  You vomit blood or material that looks like coffee grounds.  You are exposed to German measles and have never had them.  You are exposed to fifth disease or chickenpox.  You develop a severe headache.  You have shortness of breath.  You have any kind of trauma, such as from a fall or a car accident. Document Released: 02/25/2001 Document Revised: 07/18/2013 Document Reviewed: 01/11/2013 [employer] Patient Information 2015 [employer]. This information is not intended to replace advice given to you by your health care provider. Make sure you discuss any questions you have with your health care provider.   Nausea & Vomiting  Have saltine crackers or pretzels by your bed and eat a few bites before you raise your head out of bed in the morning  Eat small frequent meals throughout the day instead of large meals  Drink plenty of fluids throughout the day to stay hydrated, just don't drink a lot of fluids with your meals.  This can make your stomach fill up faster making you feel sick  Do not brush your teeth right after you eat  Products with real ginger are good for nausea, like ginger ale and ginger hard candy Make sure it says made with real ginger!  Sucking on sour candy like lemon heads is also good for nausea  If your prenatal vitamins make you nauseated, take them at night so you will sleep through the nausea  Sea Bands  If you feel like you need  medicine for the nausea & vomiting please let us know  If you are unable to keep any fluids or food down please let us know   Constipation  Drink plenty of fluid, preferably water, throughout the day  Eat foods high in fiber such as fruits, vegetables, and grains  Exercise, such as walking, is a good way to keep your bowels regular  Drink warm fluids, especially warm   prune juice, or decaf coffee  Eat a 1/2 cup of real oatmeal (not instant), 1/2 cup applesauce, and 1/2-1 cup warm prune juice every day  If needed, you may take Colace (docusate sodium) stool softener once or twice a day to help keep the stool soft. If you are pregnant, wait until you are out of your first trimester (12-14 weeks of pregnancy)  If you still are having problems with constipation, you may take Miralax once daily as needed to help keep your bowels regular.  If you are pregnant, wait until you are out of your first trimester (12-14 weeks of pregnancy)  Safe Medications in Pregnancy   Acne: Benzoyl Peroxide Salicylic Acid  Backache/Headache: Tylenol: 2 regular strength every 4 hours OR              2 Extra strength every 6 hours  Colds/Coughs/Allergies: Benadryl (alcohol free) 25 mg every 6 hours as needed Breath right strips Claritin Cepacol throat lozenges Chloraseptic throat spray Cold-Eeze- up to three times per day Cough drops, alcohol free Flonase (by prescription only) Guaifenesin Mucinex Robitussin DM (plain only, alcohol free) Saline nasal spray/drops Sudafed (pseudoephedrine) & Actifed ** use only after [redacted] weeks gestation and if you do not have high blood pressure Tylenol Vicks Vaporub Zinc lozenges Zyrtec   Constipation: Colace Ducolax suppositories Fleet enema Glycerin suppositories Metamucil Milk of magnesia Miralax Senokot Smooth move tea  Diarrhea: Kaopectate Imodium A-D  *NO pepto Bismol  Hemorrhoids: Anusol Anusol HC Preparation  H Tucks  Indigestion: Tums Maalox Mylanta Zantac  Pepcid  Insomnia: Benadryl (alcohol free) 25mg every 6 hours as needed Tylenol PM Unisom, no Gelcaps  Leg Cramps: Tums MagGel  Nausea/Vomiting:  Bonine Dramamine Emetrol Ginger extract Sea bands Meclizine  Nausea medication to take during pregnancy:  Unisom (doxylamine succinate 25 mg tablets) Take one tablet daily at bedtime. If symptoms are not adequately controlled, the dose can be increased to a maximum recommended dose of two tablets daily (1/2 tablet in the morning, 1/2 tablet mid-afternoon and one at bedtime). Vitamin B6 100mg tablets. Take one tablet twice a day (up to 200 mg per day).  Skin Rashes: Aveeno products Benadryl cream or 25mg every 6 hours as needed Calamine Lotion 1% cortisone cream  Yeast infection: Gyne-lotrimin 7 Monistat 7   **If taking multiple medications, please check labels to avoid duplicating the same active ingredients **take medication as directed on the label ** Do not exceed 4000 mg of tylenol in 24 hours **Do not take medications that contain aspirin or ibuprofen      

## 2019-01-20 NOTE — Progress Notes (Signed)
INITIAL OBSTETRICAL VISIT Patient name: Sara [name] MRN [id number]  Date of birth: 23-May-1995 Chief Complaint:   Initial Prenatal Visit (nt/it)  History of Present Illness:   Sara [name] is a 23 y.o. G26P1001 Caucasian female at [redacted]w[redacted]d by LMP/US with an Estimated Date of Delivery: 07/30/19 being seen today for her initial obstetrical visit.   Her obstetrical history is significant for term SVD w/o problems.   Today she reports no complaints.  Patient's last menstrual period was 10/23/2018. Last pap never Review of Systems:   Pertinent items are noted in HPI Denies cramping/contractions, leakage of fluid, vaginal bleeding, abnormal vaginal discharge w/ itching/odor/irritation, headaches, visual changes, shortness of breath, chest pain, abdominal pain, severe nausea/vomiting, or problems with urination or bowel movements unless otherwise stated above.  Pertinent History Reviewed:  Reviewed past medical,surgical, social, obstetrical and family history.  Reviewed problem list, medications and allergies. OB History  Gravida Para Term Preterm AB Living  2 1 1     1   SAB TAB Ectopic Multiple Live Births        0 1    # Outcome Date GA Lbr Len/2nd Weight Sex Delivery Anes PTL Lv  2 Current           1 Term 04/02/16 [redacted]w[redacted]d 10:16 / 01:08 7 lb 4.8 oz (3.31 kg) M Vag-Spont EPI N LIV   Physical Assessment:   Vitals:   01/20/19 1109  BP: 123/68  Pulse: 91  Weight: 123 lb (55.8 kg)  Body mass index is 21.79 kg/m.       Physical Examination:  General appearance - well appearing, and in no distress  Mental status - alert, oriented to person, place, and time  Psych:  She has a normal mood and affect  Skin - warm and dry, normal color, no suspicious lesions noted  Chest - effort normal, all lung fields clear to auscultation bilaterally  Heart - normal rate and regular rhythm  Abdomen - soft, nontender  Extremities:  No swelling or varicosities noted   Pelvic - VULVA: normal appearing vulva  with no masses, tenderness or lesions  VAGINA: normal appearing vagina with normal color and discharge, no lesions  CERVIX: normal appearing cervix without discharge or lesions, no CMT  Thin prep pap is done w/ HR HPV cotesting  Fetal Heart Rate (bpm): 13/05/20 via [place] 12+5 wks,measurements c/w dates,normal ovaries bilat,crl 63.46 mm,fhr 160 bpm,NB present,NT 1.6 mm  Results for orders placed or performed in visit on 01/20/19 (from the past 24 hour(s))  POC Urinalysis Dipstick OB   Collection Time: 01/20/19 11:29 AM  Result Value Ref Range   Color, UA     Clarity, UA     Glucose, UA Negative Negative   Bilirubin, UA     Ketones, UA neg    Spec Grav, UA     Blood, UA neg    pH, UA     POC,PROTEIN,UA Negative Negative, Trace, Small (1+), Moderate (2+), Large (3+), 4+   Urobilinogen, UA     Nitrite, UA neg    Leukocytes, UA Negative Negative   Appearance     Odor      Assessment & Plan:  1) low-Risk Pregnancy G2P1001 at [redacted]w[redacted]d with an Estimated Date of Delivery: 07/30/19   2) Initial OB visit   Meds:  Meds ordered this encounter  Medications  . Blood Pressure Monitor MISC    Sig: For regular home bp monitoring during pregnancy    Dispense:  1 each  Refill:  0    Z34.90    Initial labs obtained Continue prenatal vitamins Reviewed n/v relief measures and warning s/s to report Reviewed recommended weight gain based on pre-gravid BMI Encouraged well-balanced diet Watched video for carrier screening/genetic testing:  Genetic Screening discussed First Screen and Integrated Screen: requested Cystic fibrosis screening requested SMA screening requested Fragile X screening requested Ultrasound discussed; fetal survey: requested [place] completed  Follow-up: Return in about 3 weeks (around 02/10/2019) for OB Mychart visit.   Orders Placed This Encounter  Procedures  . Urine Culture  . Flu Vaccine QUAD 36+ mos IM  . Urinalysis, Routine w reflex microscopic  . Sickle Cell Scr  .  Obstetric Panel, Including HIV  . Pain Management Screening Profile (10S)  . Integrated 1  . MaterniT 21 plus Core, Blood  . Inheritest Core(CF97,SMA,FraX)  . POC Urinalysis Dipstick OB    Christin Fudge DNP, CNM 01/20/2019 11:45 AM

## 2019-01-21 LAB — PMP SCREEN PROFILE (10S), URINE
Amphetamine Scrn, Ur: NEGATIVE ng/mL
BARBITURATE SCREEN URINE: NEGATIVE ng/mL
BENZODIAZEPINE SCREEN, URINE: NEGATIVE ng/mL
CANNABINOIDS UR QL SCN: POSITIVE ng/mL — AB
Cocaine (Metab) Scrn, Ur: NEGATIVE ng/mL
Creatinine(Crt), U: 81.7 mg/dL (ref 20.0–300.0)
Methadone Screen, Urine: NEGATIVE ng/mL
OXYCODONE+OXYMORPHONE UR QL SCN: NEGATIVE ng/mL
Opiate Scrn, Ur: NEGATIVE ng/mL
Ph of Urine: 8.1 (ref 4.5–8.9)
Phencyclidine Qn, Ur: NEGATIVE ng/mL
Propoxyphene Scrn, Ur: NEGATIVE ng/mL

## 2019-01-21 LAB — OBSTETRIC PANEL, INCLUDING HIV
Antibody Screen: NEGATIVE
Basophils Absolute: 0 10*3/uL (ref 0.0–0.2)
Basos: 0 %
EOS (ABSOLUTE): 0 10*3/uL (ref 0.0–0.4)
Eos: 0 %
HIV Screen 4th Generation wRfx: NONREACTIVE
Hematocrit: 40.4 % (ref 34.0–46.6)
Hemoglobin: 14 g/dL (ref 11.1–15.9)
Hepatitis B Surface Ag: NEGATIVE
Immature Grans (Abs): 0 10*3/uL (ref 0.0–0.1)
Immature Granulocytes: 0 %
Lymphocytes Absolute: 1.3 10*3/uL (ref 0.7–3.1)
Lymphs: 19 %
MCH: 31.8 pg (ref 26.6–33.0)
MCHC: 34.7 g/dL (ref 31.5–35.7)
MCV: 92 fL (ref 79–97)
Monocytes Absolute: 0.4 10*3/uL (ref 0.1–0.9)
Monocytes: 6 %
Neutrophils Absolute: 5.3 10*3/uL (ref 1.4–7.0)
Neutrophils: 75 %
Platelets: 232 10*3/uL (ref 150–450)
RBC: 4.4 x10E6/uL (ref 3.77–5.28)
RDW: 11.8 % (ref 11.7–15.4)
RPR Ser Ql: NONREACTIVE
Rh Factor: POSITIVE
Rubella Antibodies, IGG: 6.31 index (ref >0.99)
WBC: 7.1 10*3/uL (ref 3.4–10.8)

## 2019-01-21 LAB — SICKLE CELL SCREEN: Sickle Cell Screen: NEGATIVE

## 2019-01-21 LAB — SPECIMEN STATUS REPORT

## 2019-01-25 LAB — MATERNIT 21 PLUS CORE, BLOOD
Fetal Fraction: 5
Result (T21): NEGATIVE
Trisomy 13 (Patau syndrome): NEGATIVE
Trisomy 18 (Edwards syndrome): NEGATIVE
Trisomy 21 (Down syndrome): NEGATIVE

## 2019-01-25 LAB — CYTOLOGY - PAP
Chlamydia: NEGATIVE
Comment: NEGATIVE
Comment: NEGATIVE
Comment: NORMAL
Diagnosis: NEGATIVE
High risk HPV: NEGATIVE
Neisseria Gonorrhea: NEGATIVE

## 2019-01-25 LAB — INTEGRATED 1
Crown Rump Length: 63.5 mm
Gest. Age on Collection Date: 12.6 weeks
Maternal Age at EDD: 22.7 yr
Nuchal Translucency (NT): 1.6 mm
Number of Fetuses: 1
PAPP-A Value: 672.1 ng/mL
Weight: 123 [lb_av]

## 2019-01-29 LAB — INHERITEST CORE(CF97,SMA,FRAX)

## 2019-02-05 DIAGNOSIS — Z3490 Encounter for supervision of normal pregnancy, unspecified, unspecified trimester: Secondary | ICD-10-CM | POA: Diagnosis not present

## 2019-02-14 ENCOUNTER — Telehealth (INDEPENDENT_AMBULATORY_CARE_PROVIDER_SITE_OTHER): Payer: Medicaid Other | Admitting: Advanced Practice Midwife

## 2019-02-14 ENCOUNTER — Other Ambulatory Visit: Payer: Self-pay

## 2019-02-14 ENCOUNTER — Encounter: Payer: Self-pay | Admitting: Advanced Practice Midwife

## 2019-02-14 ENCOUNTER — Encounter: Payer: Self-pay | Admitting: *Deleted

## 2019-02-14 VITALS — BP 115/88 | HR 94

## 2019-02-14 DIAGNOSIS — Z3482 Encounter for supervision of other normal pregnancy, second trimester: Secondary | ICD-10-CM

## 2019-02-14 DIAGNOSIS — Z3A16 16 weeks gestation of pregnancy: Secondary | ICD-10-CM | POA: Diagnosis not present

## 2019-02-14 DIAGNOSIS — Z363 Encounter for antenatal screening for malformations: Secondary | ICD-10-CM

## 2019-02-14 NOTE — Patient Instructions (Signed)
Sara [name], I greatly value your feedback.  If you receive a survey following your visit with [place] today, we appreciate you taking the time to fill it out.  Thanks, Nigel Berthold, CNM     [place]!!! It is now [place] at [facility] ([place], [place] [address]) Entrance located off of [address] parking   Go to [email or web address] to register for FREE online childbirth classes    Second Trimester of Pregnancy The second trimester is from week 14 through week 27 (months 4 through 6). The second trimester is often a time when you feel your best. Your body has adjusted to being pregnant, and you begin to feel better physically. Usually, morning sickness has lessened or quit completely, you may have more energy, and you may have an increase in appetite. The second trimester is also a time when the fetus is growing rapidly. At the end of the sixth month, the fetus is about 9 inches long and weighs about 1 pounds. You will likely begin to feel the baby move (quickening) between 16 and 20 weeks of pregnancy. Body changes during your second trimester Your body continues to go through many changes during your second trimester. The changes vary from woman to woman.  Your weight will continue to increase. You will notice your lower abdomen bulging out.  You may begin to get stretch marks on your hips, abdomen, and breasts.  You may develop headaches that can be relieved by medicines. The medicines should be approved by your health care provider.  You may urinate more often because the fetus is pressing on your bladder.  You may develop or continue to have heartburn as a result of your pregnancy.  You may develop constipation because certain hormones are causing the muscles that push waste through your intestines to slow down.  You may develop hemorrhoids or swollen, bulging veins (varicose veins).  You may have back  pain. This is caused by: ? Weight gain. ? Pregnancy hormones that are relaxing the joints in your pelvis. ? A shift in weight and the muscles that support your balance.  Your breasts will continue to grow and they will continue to become tender.  Your gums may bleed and may be sensitive to brushing and flossing.  Dark spots or blotches (chloasma, mask of pregnancy) may develop on your face. This will likely fade after the baby is born.  A dark line from your belly button to the pubic area (linea nigra) may appear. This will likely fade after the baby is born.  You may have changes in your hair. These can include thickening of your hair, rapid growth, and changes in texture. Some women also have hair loss during or after pregnancy, or hair that feels dry or thin. Your hair will most likely return to normal after your baby is born.  What to expect at prenatal visits During a routine prenatal visit:  You will be weighed to make sure you and the fetus are growing normally.  Your blood pressure will be taken.  Your abdomen will be measured to track your baby's growth.  The fetal heartbeat will be listened to.  Any test results from the previous visit will be discussed.  Your health care provider may ask you:  How you are feeling.  If you are feeling the baby move.  If you have had any abnormal symptoms, such as leaking fluid, bleeding, severe headaches, or  abdominal cramping.  If you are using any tobacco products, including cigarettes, chewing tobacco, and electronic cigarettes.  If you have any questions.  Other tests that may be performed during your second trimester include:  Blood tests that check for: ? Low iron levels (anemia). ? High blood sugar that affects pregnant women (gestational diabetes) between 10 and 28 weeks. ? Rh antibodies. This is to check for a protein on red blood cells (Rh factor).  Urine tests to check for infections, diabetes, or protein in the  urine.  An ultrasound to confirm the proper growth and development of the baby.  An amniocentesis to check for possible genetic problems.  Fetal screens for spina bifida and Down syndrome.  HIV (human immunodeficiency virus) testing. Routine prenatal testing includes screening for HIV, unless you choose not to have this test.  Follow these instructions at home: Medicines  Follow your health care provider's instructions regarding medicine use. Specific medicines may be either safe or unsafe to take during pregnancy.  Take a prenatal vitamin that contains at least 600 micrograms (mcg) of folic acid.  If you develop constipation, try taking a stool softener if your health care provider approves. Eating and drinking  Eat a balanced diet that includes fresh fruits and vegetables, whole grains, good sources of protein such as meat, eggs, or tofu, and low-fat dairy. Your health care provider will help you determine the amount of weight gain that is right for you.  Avoid raw meat and uncooked cheese. These carry germs that can cause birth defects in the baby.  If you have low calcium intake from food, talk to your health care provider about whether you should take a daily calcium supplement.  Limit foods that are high in fat and processed sugars, such as fried and sweet foods.  To prevent constipation: ? Drink enough fluid to keep your urine clear or pale yellow. ? Eat foods that are high in fiber, such as fresh fruits and vegetables, whole grains, and beans. Activity  Exercise only as directed by your health care provider. Most women can continue their usual exercise routine during pregnancy. Try to exercise for 30 minutes at least 5 days a week. Stop exercising if you experience uterine contractions.  Avoid heavy lifting, wear low heel shoes, and practice good posture.  A sexual relationship may be continued unless your health care provider directs you otherwise. Relieving pain and  discomfort  Wear a good support bra to prevent discomfort from breast tenderness.  Take warm sitz baths to soothe any pain or discomfort caused by hemorrhoids. Use hemorrhoid cream if your health care provider approves.  Rest with your legs elevated if you have leg cramps or low back pain.  If you develop varicose veins, wear support hose. Elevate your feet for 15 minutes, 3-4 times a day. Limit salt in your diet. Prenatal Care  Write down your questions. Take them to your prenatal visits.  Keep all your prenatal visits as told by your health care provider. This is important. Safety  Wear your seat belt at all times when driving.  Make a list of emergency phone numbers, including numbers for family, friends, the hospital, and police and fire departments. General instructions  Ask your health care provider for a referral to a local prenatal education class. Begin classes no later than the beginning of month 6 of your pregnancy.  Ask for help if you have counseling or nutritional needs during pregnancy. Your health care provider can offer advice or  refer you to specialists for help with various needs.  Do not use hot tubs, steam rooms, or saunas.  Do not douche or use tampons or scented sanitary pads.  Do not cross your legs for long periods of time.  Avoid cat litter boxes and soil used by cats. These carry germs that can cause birth defects in the baby and possibly loss of the fetus by miscarriage or stillbirth.  Avoid all smoking, herbs, alcohol, and unprescribed drugs. Chemicals in these products can affect the formation and growth of the baby.  Do not use any products that contain nicotine or tobacco, such as cigarettes and e-cigarettes. If you need help quitting, ask your health care provider.  Visit your dentist if you have not gone yet during your pregnancy. Use a soft toothbrush to brush your teeth and be gentle when you floss. Contact a health care provider if:  You  have dizziness.  You have mild pelvic cramps, pelvic pressure, or nagging pain in the abdominal area.  You have persistent nausea, vomiting, or diarrhea.  You have a bad smelling vaginal discharge.  You have pain when you urinate. Get help right away if:  You have a fever.  You are leaking fluid from your vagina.  You have spotting or bleeding from your vagina.  You have severe abdominal cramping or pain.  You have rapid weight gain or weight loss.  You have shortness of breath with chest pain.  You notice sudden or extreme swelling of your face, hands, ankles, feet, or legs.  You have not felt your baby move in over an hour.  You have severe headaches that do not go away when you take medicine.  You have vision changes. Summary  The second trimester is from week 14 through week 27 (months 4 through 6). It is also a time when the fetus is growing rapidly.  Your body goes through many changes during pregnancy. The changes vary from woman to woman.  Avoid all smoking, herbs, alcohol, and unprescribed drugs. These chemicals affect the formation and growth your baby.  Do not use any tobacco products, such as cigarettes, chewing tobacco, and e-cigarettes. If you need help quitting, ask your health care provider.  Contact your health care provider if you have any questions. Keep all prenatal visits as told by your health care provider. This is important. This information is not intended to replace advice given to you by your health care provider. Make sure you discuss any questions you have with your health care provider.

## 2019-02-14 NOTE — Progress Notes (Addendum)
   TELEHEALTH VIRTUAL OBSTETRICS VISIT ENCOUNTER NOTE  I connected with Sara [name] on 02/14/19 at 10:50 AM EST by telephone at home and verified that I am speaking with the correct person using two identifiers.   I discussed the limitations, risks, security and privacy concerns of performing an evaluation and management service by telephone and the availability of in person appointments. I also discussed with the patient that there may be a patient responsible charge related to this service. The patient expressed understanding and agreed to proceed.  Subjective:  Sara [name] is a 23 y.o. G2P1001 at [redacted]w[redacted]d being followed for ongoing prenatal care.  She is currently monitored for the following issues for this low-risk pregnancy and has SUI (stress urinary incontinence, female); Depression with anxiety; and Supervision of normal pregnancy on their problem list.  Patient reports no complaints. Reports fetal movement. Denies any contractions, bleeding or leaking of fluid.   The following portions of the patient's history were reviewed and updated as appropriate: allergies, current medications, past family history, past medical history, past social history, past surgical history and problem list.   Objective:   General:  Alert, oriented and cooperative.   Mental Status: Normal mood and affect perceived. Normal judgment and thought content.  Rest of physical exam deferred due to type of encounter  Assessment and Plan:  Pregnancy: G2P1001 at [redacted]w[redacted]d 1. Encounter for supervision of other normal pregnancy in second trimester - Pt has no complaints today - 2nd IT and anatomy u/s at next visit - Continue routine prenatal care   Preterm labor symptoms and general obstetric precautions including but not limited to vaginal bleeding, contractions, leaking of fluid and fetal movement were reviewed in detail with the patient.  I discussed the assessment and treatment plan with the patient. The patient  was provided an opportunity to ask questions and all were answered. The patient agreed with the plan and demonstrated an understanding of the instructions. The patient was advised to call back or seek an in-person office evaluation/go to [facility] at [facility] for any urgent or concerning symptoms. Please refer to After Visit Summary for other counseling recommendations.   I provided 10 minutes of non-face-to-face time during this encounter.   Return for 2-3 weeks for anatomy scan/2nd IT.  Maryagnes Amos, [place] for [facility]   I personally saw and evaluated the patient, performing the key elements of the service. I developed and verified the management plan that is described in the resident's/student's note, and I agree with the content with my edits above.  Nigel Berthold, CNM 02/14/2019 3:02 PM

## 2019-02-28 ENCOUNTER — Other Ambulatory Visit: Payer: Self-pay | Admitting: Advanced Practice Midwife

## 2019-02-28 DIAGNOSIS — Z363 Encounter for antenatal screening for malformations: Secondary | ICD-10-CM

## 2019-03-01 ENCOUNTER — Ambulatory Visit
Admission: EM | Admit: 2019-03-01 | Discharge: 2019-03-01 | Disposition: A | Discharge status: Home / Self Care | Payer: Medicaid Other | Attending: Urgent Care | Admitting: Urgent Care

## 2019-03-01 ENCOUNTER — Other Ambulatory Visit: Payer: Self-pay

## 2019-03-01 ENCOUNTER — Other Ambulatory Visit: Payer: Medicaid Other

## 2019-03-01 ENCOUNTER — Encounter: Payer: Self-pay | Admitting: Urgent Care

## 2019-03-01 ENCOUNTER — Encounter: Payer: Medicaid Other | Admitting: Family Medicine

## 2019-03-01 DIAGNOSIS — Z3490 Encounter for supervision of normal pregnancy, unspecified, unspecified trimester: Secondary | ICD-10-CM

## 2019-03-01 DIAGNOSIS — Z20822 Contact with and (suspected) exposure to covid-19: Secondary | ICD-10-CM

## 2019-03-01 DIAGNOSIS — Z20828 Contact with and (suspected) exposure to other viral communicable diseases: Secondary | ICD-10-CM | POA: Diagnosis not present

## 2019-03-01 NOTE — ED Triage Notes (Signed)
Pt here for covid test after positive exposure on Saturday   

## 2019-03-01 NOTE — ED Provider Notes (Signed)
[facility]   MRN: [id number] DOB: 25-Aug-1995  Subjective:   Sara [name] is a 23 y.o. female presenting for COVID 19 testing. Had exposure to COVID 19 on Saturday. Patient is currently taking amoxicillin for dental infection.   No current facility-administered medications for this encounter.  Current Outpatient Medications:  .  Blood Pressure Monitor MISC, For regular home bp monitoring during pregnancy, Disp: 1 each, Rfl: 0 .  Prenatal Vit-Fe Fumarate-FA (PREPLUS) 27-1 MG TABS, Take 1 tablet by mouth daily., Disp: 30 tablet, Rfl: 13   No Known Allergies  Past Medical History:  Diagnosis Date  . Asthma   . Kidney stone    UTI     Past Surgical History:  Procedure Laterality Date  . CYSTOSCOPY WITH URETEROSCOPY, STONE BASKETRY AND STENT PLACEMENT Right 10/06/2014   Procedure: CYSTOSCOPY WITH URETEROSCOPY, STONE BASKETRY AND STENT PLACEMENT, RETROGRADE;  Surgeon: Malen Gauze, MD;  Location: [facility];  Service: Urology;  Laterality: Right;  . TONSILLECTOMY      Family History  Problem Relation Age of Onset  . Bipolar disorder Mother   . Heart murmur Mother   . Alcohol abuse Mother   . Cancer Mother        breast  . Hypertension Father   . Hypertension Paternal Grandfather   . Arthritis Neg Hx     Social History   Tobacco Use  . Smoking status: Former Smoker    Types: Cigarettes    Quit date: 06/13/2015    Years since quitting: 3.7  . Smokeless tobacco: Never Used  Substance Use Topics  . Alcohol use: No    Alcohol/week: 0.0 standard drinks  . Drug use: No    Review of Systems  Constitutional: Negative for fever and malaise/fatigue.  HENT: Negative for congestion, ear pain, sinus pain and sore throat.   Eyes: Negative for discharge and redness.  Respiratory: Negative for cough, hemoptysis, shortness of breath and wheezing.   Cardiovascular: Negative for chest pain.  Gastrointestinal: Negative for abdominal pain, diarrhea, nausea and  vomiting.  Genitourinary: Negative for dysuria, flank pain and hematuria.  Musculoskeletal: Negative for myalgias.  Skin: Negative for rash.  Neurological: Negative for dizziness, weakness and headaches.  Psychiatric/Behavioral: Negative for depression and substance abuse.     Objective:   Vitals: BP 112/76   Pulse (!) 106   Temp 98.8 F (37.1 C)   Resp 18   LMP 10/23/2018   SpO2 98%   Physical Exam Constitutional:      General: She is not in acute distress.    Appearance: Normal appearance. She is well-developed. She is not ill-appearing, toxic-appearing or diaphoretic.  HENT:     Head: Normocephalic and atraumatic.     Nose: Nose normal.     Mouth/Throat:     Mouth: Mucous membranes are moist.  Eyes:     Extraocular Movements: Extraocular movements intact.     Pupils: Pupils are equal, round, and reactive to light.  Cardiovascular:     Rate and Rhythm: Normal rate and regular rhythm.     Pulses: Normal pulses.     Heart sounds: Normal heart sounds. No murmur. No friction rub. No gallop.   Pulmonary:     Effort: Pulmonary effort is normal. No respiratory distress.     Breath sounds: Normal breath sounds. No stridor. No wheezing, rhonchi or rales.  Skin:    General: Skin is warm and dry.     Findings: No rash.  Neurological:  Mental Status: She is alert and oriented to person, place, and time.  Psychiatric:        Mood and Affect: Mood normal.        Behavior: Behavior normal.        Thought Content: Thought content normal.        Judgment: Judgment normal.     Assessment and Plan :   1. Exposure to COVID-19 virus   2. Pregnancy, unspecified gestational age     Will manage COVID-54 with supportive care. Counseled patient on nature of COVID-19 including modes of transmission, diagnostic testing, management and supportive care.  Offered symptomatic relief. Counseled patient on potential for adverse effects with medications prescribed/recommended today, ER  and return-to-clinic precautions discussed, patient verbalized understanding.     Jaynee Eagles, PA-C 03/01/19 1459

## 2019-03-01 NOTE — Discharge Instructions (Addendum)

## 2019-03-03 ENCOUNTER — Other Ambulatory Visit: Payer: Medicaid Other

## 2019-03-03 LAB — NOVEL CORONAVIRUS, NAA: SARS-CoV-2, NAA: NOT DETECTED

## 2019-03-15 ENCOUNTER — Other Ambulatory Visit: Payer: Self-pay

## 2019-03-15 ENCOUNTER — Ambulatory Visit (INDEPENDENT_AMBULATORY_CARE_PROVIDER_SITE_OTHER): Payer: Medicaid Other

## 2019-03-15 ENCOUNTER — Encounter: Payer: Self-pay | Admitting: Obstetrics & Gynecology

## 2019-03-15 ENCOUNTER — Ambulatory Visit (INDEPENDENT_AMBULATORY_CARE_PROVIDER_SITE_OTHER): Payer: Medicaid Other | Admitting: Obstetrics & Gynecology

## 2019-03-15 VITALS — BP 121/69 | HR 86 | Wt 135.0 lb

## 2019-03-15 DIAGNOSIS — Z3A20 20 weeks gestation of pregnancy: Secondary | ICD-10-CM | POA: Diagnosis not present

## 2019-03-15 DIAGNOSIS — Z363 Encounter for antenatal screening for malformations: Secondary | ICD-10-CM | POA: Diagnosis not present

## 2019-03-15 DIAGNOSIS — Z1389 Encounter for screening for other disorder: Secondary | ICD-10-CM

## 2019-03-15 DIAGNOSIS — Z1379 Encounter for other screening for genetic and chromosomal anomalies: Secondary | ICD-10-CM

## 2019-03-15 DIAGNOSIS — Z3482 Encounter for supervision of other normal pregnancy, second trimester: Secondary | ICD-10-CM

## 2019-03-15 DIAGNOSIS — Z331 Pregnant state, incidental: Secondary | ICD-10-CM

## 2019-03-15 DIAGNOSIS — Z3402 Encounter for supervision of normal first pregnancy, second trimester: Secondary | ICD-10-CM

## 2019-03-15 LAB — POCT URINALYSIS DIPSTICK OB
Blood, UA: NEGATIVE
Glucose, UA: NEGATIVE
Ketones, UA: NEGATIVE
Leukocytes, UA: NEGATIVE
Nitrite, UA: NEGATIVE
POC,PROTEIN,UA: NEGATIVE

## 2019-03-15 NOTE — Progress Notes (Signed)
   LOW-RISK PREGNANCY VISIT Patient name: Sara [name] MRN [id number]  Date of birth: 1995/04/08 Chief Complaint:   Routine Prenatal Visit  History of Present Illness:   Sara [name] is a 23 y.o. G55P1001 female at [redacted]w[redacted]d with an Estimated Date of Delivery: 07/30/19 being seen today for ongoing management of a low-risk pregnancy.  Today she reports no complaints. Contractions: Not present. Vag. Bleeding: None.  Movement: Present. denies leaking of fluid. Review of Systems:   Pertinent items are noted in HPI Denies abnormal vaginal discharge w/ itching/odor/irritation, headaches, visual changes, shortness of breath, chest pain, abdominal pain, severe nausea/vomiting, or problems with urination or bowel movements unless otherwise stated above. Pertinent History Reviewed:  Reviewed past medical,surgical, social, obstetrical and family history.  Reviewed problem list, medications and allergies. Physical Assessment:   Vitals:   03/15/19 1541  BP: 121/69  Pulse: 86  Weight: 135 lb (61.2 kg)  Body mass index is 23.91 kg/m.        Physical Examination:   General appearance: Well appearing, and in no distress  Mental status: Alert, oriented to person, place, and time  Skin: Warm & dry  Cardiovascular: Normal heart rate noted  Respiratory: Normal respiratory effort, no distress  Abdomen: Soft, gravid, nontender  Pelvic: Cervical exam deferred         Extremities:    Fetal Status: Fetal Heart Rate (bpm): 152 Fundal Height: 20 cm Movement: Present    Chaperone: n/a    Results for orders placed or performed in visit on 03/15/19 (from the past 24 hour(s))  POC Urinalysis Dipstick OB   Collection Time: 03/15/19  3:44 PM  Result Value Ref Range   Color, UA     Clarity, UA     Glucose, UA Negative Negative   Bilirubin, UA     Ketones, UA n    Spec Grav, UA     Blood, UA n    pH, UA     POC,PROTEIN,UA Negative Negative, Trace, Small (1+), Moderate (2+), Large (3+), 4+   Urobilinogen,  UA     Nitrite, UA n    Leukocytes, UA Negative Negative   Appearance     Odor      Assessment & Plan:  1) Low-risk pregnancy G2P1001 at [redacted]w[redacted]d with an Estimated Date of Delivery: 07/30/19   2) Anatomy scan normal   Meds: No orders of the defined types were placed in this encounter.  Labs/procedures today: sonogram normal  Plan:  Continue routine obstetrical care  Next visit: prefers online    Reviewed: Preterm labor symptoms and general obstetric precautions including but not limited to vaginal bleeding, contractions, leaking of fluid and fetal movement were reviewed in detail with the patient.  All questions were answered. Has home bp cuff. Rx faxed to . Check bp weekly, let us know if >140/90.   Follow-up: Return in about 4 weeks (around 04/12/2019) for [place] visit, LROB.  Orders Placed This Encounter  Procedures  . INTEGRATED 2  . POC Urinalysis Dipstick OB   Mertie Clause Aleisha Paone 03/15/2019 4:09 PM

## 2019-03-15 NOTE — Progress Notes (Signed)
[place] 84+5 wks,cephalic,fhr 364 bpm,cx 3.2 cm,normal ovaries,svp of fluid 3.4 cm,efw 350 g 41%,posterior placenta gr 0,anatomy complete,no obvious abnormalities

## 2019-03-17 LAB — INTEGRATED 2
AFP MoM: 0.88
Alpha-Fetoprotein: 52.4 ng/mL
Crown Rump Length: 63.5 mm
DIA MoM: 0.9
DIA Value: 192.6 pg/mL
Estriol, Unconjugated: 2.78 ng/mL
Gest. Age on Collection Date: 12.6 weeks
Gestational Age: 20.3 weeks
Maternal Age at EDD: 22.7 yr
Nuchal Translucency (NT): 1.6 mm
Nuchal Translucency MoM: 1.13
Number of Fetuses: 1
PAPP-A MoM: 0.56
PAPP-A Value: 672.1 ng/mL
Test Results:: NEGATIVE
Weight: 123 [lb_av]
Weight: 134 [lb_av]
hCG MoM: 0.83
hCG Value: 19 IU/mL
uE3 MoM: 1.13

## 2019-03-18 NOTE — L&D Delivery Note (Addendum)
OB/GYN Faculty Practice Delivery Note  Sara [name] is a 24 y.o. G2P1001 s/p SVD at [redacted]w[redacted]d. She was admitted for SOL.   ROM: 0h 52m with clear fluid GBS Status: GBS Negative/-- (04/20 0000) Maximum Maternal Temperature: 98.71F  Labor Progress: . Initial SVE: Presented with contractions at 6cm/90%/-2 station. AROM with clear fluid at 1522. She then progressed to complete.   Delivery Date/Time: 07/18/19 @ 1554 Delivery: Called to room and patient was complete and pushing. Head delivered LOA. No nuchal cord present. Shoulder and body delivered in usual fashion. Infant with spontaneous cry, placed on mother's abdomen, dried and stimulated. Cord clamped x 2 after 1-minute delay, and cut by FOB. Cord blood drawn. Placenta delivered spontaneously with gentle cord traction. Fundus firm with massage and Pitocin. Labia, perineum, vagina, and cervix inspected inspected without lacerations.  Baby Weight: pending  Placenta: Sent to L&D Complications: None Lacerations: None EBL: 55 mL Analgesia: Epidural   Infant:  APGAR (1 MIN):  9 APGAR (5 MINS): [address], DO, PGY1 07/18/2019, 4:04 PM  OB FELLOW DELIVERY ATTESTATION  I was gloved and present for the delivery in its entirety, and I agree with the above resident's note.    Jerilynn Birkenhead, MD [facility] Family Medicine Fellow, [facility] for [employer], [facility]

## 2019-04-12 ENCOUNTER — Telehealth: Payer: Medicaid Other | Admitting: Advanced Practice Midwife

## 2019-04-14 ENCOUNTER — Telehealth (INDEPENDENT_AMBULATORY_CARE_PROVIDER_SITE_OTHER): Payer: Medicaid Other | Admitting: Family Medicine

## 2019-04-14 ENCOUNTER — Encounter: Payer: Self-pay | Admitting: Family Medicine

## 2019-04-14 ENCOUNTER — Other Ambulatory Visit: Payer: Self-pay

## 2019-04-14 VITALS — BP 120/73 | HR 97 | Ht 63.0 in

## 2019-04-14 DIAGNOSIS — Z3A24 24 weeks gestation of pregnancy: Secondary | ICD-10-CM | POA: Diagnosis not present

## 2019-04-14 DIAGNOSIS — Z3482 Encounter for supervision of other normal pregnancy, second trimester: Secondary | ICD-10-CM

## 2019-04-14 DIAGNOSIS — Z8759 Personal history of other complications of pregnancy, childbirth and the puerperium: Secondary | ICD-10-CM | POA: Insufficient documentation

## 2019-04-14 NOTE — Progress Notes (Signed)
I connected with@ on 04/14/19 at  1:30 PM EST by: Mychart and verified that I am speaking with the correct person using two identifiers.  Patient is located at home and provider is located at [facility].     The purpose of this virtual visit is to provide medical care while limiting exposure to the novel coronavirus. I discussed the limitations, risks, security and privacy concerns of performing an evaluation and management service by Mychart and the availability of in person appointments. I also discussed with the patient that there may be a patient responsible charge related to this service. By engaging in this virtual visit, you consent to the provision of healthcare.  Additionally, you authorize for your insurance to be billed for the services provided during this visit.  The patient expressed understanding and agreed to proceed.  The following staff members participated in the virtual visit:  [facility] LPN      PRENATAL VISIT NOTE  Subjective:  Sara [name] is a 24 y.o. G2P1001 at [redacted]w[redacted]d  for phone visit for ongoing prenatal care.  She is currently monitored for the following issues for this low-risk pregnancy and has SUI (stress urinary incontinence, female); Depression with anxiety; Supervision of normal pregnancy; and History of postpartum hemorrhage on their problem list.  Patient reports no complaints.  Contractions: Not present. Vag. Bleeding: None.  Movement: Present. Denies leaking of fluid.   The following portions of the patient's history were reviewed and updated as appropriate: allergies, current medications, past family history, past medical history, past social history, past surgical history and problem list.   Objective:   Vitals:   04/14/19 1339 04/14/19 1340  BP:  120/73  Pulse:  97  Height: 5\' 3"  (1.6 m)    Self-Obtained  Fetal Status:     Movement: Present     Assessment and Plan:  Pregnancy: G2P1001 at [redacted]w[redacted]d 1. Encounter for supervision of other normal pregnancy in  second trimester Up to date Reviewed pregnancy box and updated appropriately Discussed next visit in person for 28 wk labs etc.   2. History of postpartum hemorrhage Patient has questions about risk of bleeding after this delivery. Reviewed history of PPH-- had normal EBl at delivery and then passed several clots afterwards with trickling. Was placed on methergine series. I have added PPH to problem list to alert inpatient team.   Preterm labor symptoms and general obstetric precautions including but not limited to vaginal bleeding, contractions, leaking of fluid and fetal movement were reviewed in detail with the patient.  Return in about 4 weeks (around 05/12/2019) for Routine prenatal care, in person, 28 wk labs.  No future appointments.   Time spent on virtual visit: 11 minutes  05/14/2019, MD

## 2019-04-15 ENCOUNTER — Ambulatory Visit
Admission: EM | Admit: 2019-04-15 | Discharge: 2019-04-15 | Disposition: A | Discharge status: Home / Self Care | Payer: Medicaid Other | Attending: Emergency Medicine | Admitting: Emergency Medicine

## 2019-04-15 ENCOUNTER — Other Ambulatory Visit: Payer: Self-pay

## 2019-04-15 DIAGNOSIS — Z20822 Contact with and (suspected) exposure to covid-19: Secondary | ICD-10-CM

## 2019-04-15 DIAGNOSIS — Z711 Person with feared health complaint in whom no diagnosis is made: Secondary | ICD-10-CM

## 2019-04-15 NOTE — ED Triage Notes (Signed)
Pt here for covid test, possible exposure today , no stmptoms. boyfriend has symptoms and is getting tested today

## 2019-04-15 NOTE — Discharge Instructions (Signed)

## 2019-04-15 NOTE — ED Provider Notes (Signed)
[facility]    CSN: [id number] Arrival date & time: 04/15/19  1119      History   Chief Complaint No chief complaint on file.   HPI Sara [name] is a 24 y.o. female.   Sara [name] 24 years old female presented to the urgent care with a complaint of Covid exposure today.  Denies sick exposure to  flu or strep.  Denies recent travel.  Denies aggravating or alleviating symptoms.  Denies previous COVID infection.   Denies fever, chills, fatigue, nasal congestion, rhinorrhea, sore throat, cough, SOB, wheezing, chest pain, nausea, vomiting, changes in bowel or bladder habits.       Past Medical History:  Diagnosis Date  . Asthma   . Kidney stone    UTI    Patient Active Problem List   Diagnosis Date Noted  . History of postpartum hemorrhage 04/14/2019  . Supervision of normal pregnancy 01/20/2019  . Depression with anxiety 02/16/2017  . SUI (stress urinary incontinence, female) 05/08/2016    Past Surgical History:  Procedure Laterality Date  . CYSTOSCOPY WITH URETEROSCOPY, STONE BASKETRY AND STENT PLACEMENT Right 10/06/2014   Procedure: CYSTOSCOPY WITH URETEROSCOPY, STONE BASKETRY AND STENT PLACEMENT, RETROGRADE;  Surgeon: Cleon Gustin, MD;  Location: [facility];  Service: Urology;  Laterality: Right;  . TONSILLECTOMY      OB History    Gravida  2   Para  1   Term  1   Preterm      AB      Living  1     SAB      TAB      Ectopic      Multiple  0   Live Births  1            Home Medications    Prior to Admission medications   Medication Sig Start Date End Date Taking? Authorizing Provider  Blood Pressure Monitor MISC For regular home bp monitoring during pregnancy 01/20/19   Cresenzo-Dishmon, Joaquim Lai, CNM  Prenatal Vit-Fe Fumarate-FA (PREPLUS) 27-1 MG TABS Take 1 tablet by mouth daily. 12/01/18   Chancy Milroy, MD    Family History Family History  Problem Relation Age of Onset  . Bipolar disorder Mother   . Heart murmur  Mother   . Alcohol abuse Mother   . Cancer Mother        breast  . Hypertension Father   . Hypertension Paternal Grandfather   . Arthritis Neg Hx     Social History Social History   Tobacco Use  . Smoking status: Former Smoker    Types: Cigarettes    Quit date: 06/13/2015    Years since quitting: 3.8  . Smokeless tobacco: Never Used  Substance Use Topics  . Alcohol use: No    Alcohol/week: 0.0 standard drinks  . Drug use: No     Allergies   Patient has no known allergies.   Review of Systems Review of Systems  Constitutional: Negative.   HENT: Negative.   Respiratory: Negative.   Cardiovascular: Negative.   Gastrointestinal: Negative.   Neurological: Negative.   All other systems reviewed and are negative.    Physical Exam Triage Vital Signs ED Triage Vitals  Enc Vitals Group     BP 04/15/19 1155 100/65     Pulse Rate 04/15/19 1155 89     Resp 04/15/19 1155 18     Temp 04/15/19 1155 98.2 F (36.8 C)     Temp src --  SpO2 04/15/19 1155 99 %     Weight --      Height --      Head Circumference --      Peak Flow --      Pain Score 04/15/19 1153 0     Pain Loc --      Pain Edu? --      Excl. in GC? --    No data found.  Updated Vital Signs BP 100/65   Pulse 89   Temp 98.2 F (36.8 C)   Resp 18   LMP 10/23/2018   SpO2 99%   Visual Acuity Right Eye Distance:   Left Eye Distance:   Bilateral Distance:    Right Eye Near:   Left Eye Near:    Bilateral Near:     Physical Exam Vitals and nursing note reviewed.  Constitutional:      General: She is not in acute distress.    Appearance: Normal appearance. She is normal weight. She is not ill-appearing or toxic-appearing.  HENT:     Head: Normocephalic.     Right Ear: Tympanic membrane, ear canal and external ear normal. There is no impacted cerumen.     Left Ear: Tympanic membrane, ear canal and external ear normal. There is no impacted cerumen.     Nose: Nose normal. No congestion.      Mouth/Throat:     Mouth: Mucous membranes are moist.     Pharynx: Oropharynx is clear. No oropharyngeal exudate or posterior oropharyngeal erythema.  Cardiovascular:     Rate and Rhythm: Normal rate and regular rhythm.     Pulses: Normal pulses.     Heart sounds: Normal heart sounds. No murmur.  Pulmonary:     Effort: Pulmonary effort is normal. No respiratory distress.     Breath sounds: Normal breath sounds. No wheezing or rhonchi.  Chest:     Chest wall: No tenderness.  Abdominal:     General: Abdomen is flat. Bowel sounds are normal. There is no distension.     Palpations: There is no mass.     Tenderness: There is no abdominal tenderness.  Skin:    Capillary Refill: Capillary refill takes less than 2 seconds.  Neurological:     General: No focal deficit present.     Mental Status: She is alert and oriented to person, place, and time.      UC Treatments / Results  Labs (all labs ordered are listed, but only abnormal results are displayed) Labs Reviewed  NOVEL CORONAVIRUS, NAA    EKG   Radiology No results found.  Procedures Procedures (including critical care time)  Medications Ordered in UC Medications - No data to display  Initial Impression / Assessment and Plan / UC Course  I have reviewed the triage vital signs and the nursing notes.  Pertinent labs & imaging results that were available during my care of the patient were reviewed by me and considered in my medical decision making (see chart for details).  POC COVID-19 test was ordered Advised patient to quarantine To go to ED for worsening of symptoms Patient verbalized understanding of the plan of care  Final Clinical Impressions(s) / UC Diagnoses   Final diagnoses:  Suspected COVID-19 virus infection     Discharge Instructions     COVID testing ordered.  It will take between 2-7 days for test results.  Someone will contact you regarding abnormal results.    In the meantime: You should  remain isolated  in your home for 10 days from symptom onset AND greater than 72 hours after symptoms resolution (absence of fever without the use of fever-reducing medication and improvement in respiratory symptoms), whichever is longer Get plenty of rest and push fluids Use medications daily for symptom relief Use OTC medications like ibuprofen or tylenol as needed fever or pain Call or go to the ED if you have any new or worsening symptoms such as fever, worsening cough, shortness of breath, chest tightness, chest pain, turning blue, changes in mental status, etc...     ED Prescriptions    None     PDMP not reviewed this encounter.   Durward Parcel, FNP 04/15/19 1212

## 2019-04-16 ENCOUNTER — Encounter (HOSPITAL_COMMUNITY): Payer: Self-pay

## 2019-04-16 ENCOUNTER — Telehealth (HOSPITAL_COMMUNITY): Payer: Self-pay | Admitting: Emergency Medicine

## 2019-04-16 LAB — NOVEL CORONAVIRUS, NAA: SARS-CoV-2, NAA: DETECTED — AB

## 2019-04-16 NOTE — Telephone Encounter (Signed)
Your test for COVID-19 was positive, meaning that you were infected with the novel coronavirus and could give the germ to others.  Please continue isolation at home for at least 10 days since the start of your symptoms. If you do not have symptoms, please isolate at home for 10 days from the day you were tested. Once you complete your 10 day quarantine, you may return to normal activities as long as you've not had a fever for over 24 hours(without taking fever reducing medicine) and your symptoms are improving. Please continue good preventive care measures, including:  frequent hand-washing, avoid touching your face, cover coughs/sneezes, stay out of crowds and keep a 6 foot distance from others.  Go to the nearest hospital emergency room if fever/cough/breathlessness are severe or illness seems like a threat to life.  Attempted to reach patient. No answer at this time. Voicemail full  If you have any questions, you may call me at [phone]    

## 2019-04-17 ENCOUNTER — Encounter: Payer: Self-pay | Admitting: Medical

## 2019-04-17 DIAGNOSIS — U071 COVID-19: Secondary | ICD-10-CM | POA: Insufficient documentation

## 2019-05-12 ENCOUNTER — Ambulatory Visit (INDEPENDENT_AMBULATORY_CARE_PROVIDER_SITE_OTHER): Payer: Medicaid Other | Admitting: Advanced Practice Midwife

## 2019-05-12 ENCOUNTER — Other Ambulatory Visit: Payer: Self-pay

## 2019-05-12 VITALS — BP 114/73 | HR 95 | Wt 146.0 lb

## 2019-05-12 DIAGNOSIS — Z1389 Encounter for screening for other disorder: Secondary | ICD-10-CM

## 2019-05-12 DIAGNOSIS — Z331 Pregnant state, incidental: Secondary | ICD-10-CM

## 2019-05-12 DIAGNOSIS — Z3483 Encounter for supervision of other normal pregnancy, third trimester: Secondary | ICD-10-CM

## 2019-05-12 DIAGNOSIS — Z3A28 28 weeks gestation of pregnancy: Secondary | ICD-10-CM

## 2019-05-12 NOTE — Patient Instructions (Signed)

## 2019-05-12 NOTE — Progress Notes (Signed)
   LOW-RISK PREGNANCY VISIT Patient name: Sara [name] MRN [id number]  Date of birth: 17-May-1995 Chief Complaint:   Routine Prenatal Visit  History of Present Illness:   Sara [name] is a 24 y.o. G77P1001 female at [redacted]w[redacted]d with an Estimated Date of Delivery: 07/30/19 being seen today for ongoing management of a low-risk pregnancy.  Today she reports no complaints. Contractions: Not present. Vag. Bleeding: None.  Movement: Present. denies leaking of fluid. Review of Systems:   Pertinent items are noted in HPI Denies abnormal vaginal discharge w/ itching/odor/irritation, headaches, visual changes, shortness of breath, chest pain, abdominal pain, severe nausea/vomiting, or problems with urination or bowel movements unless otherwise stated above. Pertinent History Reviewed:  Reviewed past medical,surgical, social, obstetrical and family history.  Reviewed problem list, medications and allergies. Physical Assessment:   Vitals:   05/12/19 1334  BP: 114/73  Pulse: 95  Weight: 146 lb (66.2 kg)  Body mass index is 25.86 kg/m.        Physical Examination:   General appearance: Well appearing, and in no distress  Mental status: Alert, oriented to person, place, and time  Skin: Warm & dry  Cardiovascular: Normal heart rate noted  Respiratory: Normal respiratory effort, no distress  Abdomen: Soft, gravid, nontender  Pelvic: Cervical exam deferred         Extremities: Edema: None  Fetal Status: Fetal Heart Rate (bpm): 140   Movement: Present    Chaperone: n/a    No results found for this or any previous visit (from the past 24 hour(s)).  Assessment & Plan:  1) Low-risk pregnancy G2P1001 at [redacted]w[redacted]d with an Estimated Date of Delivery: 07/30/19    Meds: No orders of the defined types were placed in this encounter.  Labs/procedures today:   Plan:  Continue routine obstetrical care  Next visit: prefers online    Reviewed: Preterm labor symptoms and general obstetric precautions including  but not limited to vaginal bleeding, contractions, leaking of fluid and fetal movement were reviewed in detail with the patient.  All questions were answered. Has home bp cuff.. Check bp weekly, let us know if >140/90.   Follow-up: Return in about 3 weeks (around 06/02/2019) for OB Mychart visit and ASAP for PN2 only.  Orders Placed This Encounter  Procedures  . POC Urinalysis Dipstick OB   Jacklyn Shell DNP, CNM 05/12/2019 1:50 PM

## 2019-05-18 ENCOUNTER — Other Ambulatory Visit: Payer: Self-pay

## 2019-05-18 ENCOUNTER — Other Ambulatory Visit: Payer: Medicaid Other

## 2019-05-18 DIAGNOSIS — Z131 Encounter for screening for diabetes mellitus: Secondary | ICD-10-CM | POA: Diagnosis not present

## 2019-05-18 DIAGNOSIS — Z3483 Encounter for supervision of other normal pregnancy, third trimester: Secondary | ICD-10-CM

## 2019-05-18 DIAGNOSIS — Z3A29 29 weeks gestation of pregnancy: Secondary | ICD-10-CM

## 2019-05-19 ENCOUNTER — Encounter: Payer: Self-pay | Admitting: Advanced Practice Midwife

## 2019-05-19 ENCOUNTER — Other Ambulatory Visit: Payer: Self-pay | Admitting: *Deleted

## 2019-05-19 DIAGNOSIS — O0990 Supervision of high risk pregnancy, unspecified, unspecified trimester: Secondary | ICD-10-CM

## 2019-05-19 DIAGNOSIS — O24419 Gestational diabetes mellitus in pregnancy, unspecified control: Secondary | ICD-10-CM | POA: Insufficient documentation

## 2019-05-19 DIAGNOSIS — O24410 Gestational diabetes mellitus in pregnancy, diet controlled: Secondary | ICD-10-CM

## 2019-05-19 LAB — CBC
Hematocrit: 39.6 % (ref 34.0–46.6)
Hemoglobin: 13.8 g/dL (ref 11.1–15.9)
MCH: 33.4 pg — ABNORMAL HIGH (ref 26.6–33.0)
MCHC: 34.8 g/dL (ref 31.5–35.7)
MCV: 96 fL (ref 79–97)
Platelets: 174 10*3/uL (ref 150–450)
RBC: 4.13 x10E6/uL (ref 3.77–5.28)
RDW: 12.2 % (ref 11.7–15.4)
WBC: 9.4 10*3/uL (ref 3.4–10.8)

## 2019-05-19 LAB — RPR: RPR Ser Ql: NONREACTIVE

## 2019-05-19 LAB — GLUCOSE TOLERANCE, 2 HOURS W/ 1HR
Glucose, 1 hour: 200 mg/dL — ABNORMAL HIGH (ref 65–179)
Glucose, 2 hour: 118 mg/dL (ref 65–152)
Glucose, Fasting: 91 mg/dL (ref 65–91)

## 2019-05-19 LAB — ANTIBODY SCREEN: Antibody Screen: NEGATIVE

## 2019-05-19 LAB — HIV ANTIBODY (ROUTINE TESTING W REFLEX): HIV Screen 4th Generation wRfx: NONREACTIVE

## 2019-05-19 MED ORDER — ACCU-CHEK GUIDE VI STRP: ORAL_STRIP | 12 refills | Status: DC

## 2019-05-19 MED ORDER — ACCU-CHEK GUIDE ME W/DEVICE KIT: 1.0000 | PACK | Freq: Four times a day (QID) | 0 refills | Status: DC

## 2019-05-19 MED ORDER — ACCU-CHEK SOFTCLIX LANCETS MISC: 12 refills | Status: DC

## 2019-05-23 ENCOUNTER — Encounter: Payer: Self-pay | Admitting: *Deleted

## 2019-05-23 ENCOUNTER — Telehealth: Payer: Self-pay | Admitting: Obstetrics & Gynecology

## 2019-05-23 NOTE — Telephone Encounter (Signed)
Called the patient to schedule to inform of the upcoming appointment. Left a voicemail message advising the patient of the upcoming scheduled appointment however there is an available appointment on Thursday. If she would like to schedule for Thursday please give our office a call back. Also mailing an appointment reminder.

## 2019-05-31 ENCOUNTER — Ambulatory Visit: Payer: Medicaid Other | Admitting: Registered"

## 2019-05-31 ENCOUNTER — Other Ambulatory Visit: Payer: Self-pay

## 2019-05-31 ENCOUNTER — Encounter: Payer: Medicaid Other | Attending: Advanced Practice Midwife | Admitting: Registered"

## 2019-05-31 DIAGNOSIS — Z713 Dietary counseling and surveillance: Secondary | ICD-10-CM | POA: Diagnosis not present

## 2019-05-31 DIAGNOSIS — O24410 Gestational diabetes mellitus in pregnancy, diet controlled: Secondary | ICD-10-CM | POA: Diagnosis not present

## 2019-05-31 DIAGNOSIS — Z3A00 Weeks of gestation of pregnancy not specified: Secondary | ICD-10-CM | POA: Insufficient documentation

## 2019-05-31 DIAGNOSIS — O0990 Supervision of high risk pregnancy, unspecified, unspecified trimester: Secondary | ICD-10-CM | POA: Insufficient documentation

## 2019-05-31 NOTE — Progress Notes (Signed)
Patient was seen on 05/31/19 for Gestational Diabetes self-management. EDD 07/30/19. Patient states no history of GDM. Diet history obtained. Patient does not eat a variety of all food groups, patient does not eat vegetables. Beverages include water.    Patient was had trouble finding location and since patient travelled from [place], [place] gave the patient an abbreviated appointment. Main points to control blood sugar were covered. RD encouraged patient to schedule follow-up appointment if she finds that her blood sugar readings are out of range and/or would like to continue nutrition education.  The following learning objectives were met by the patient :   States the definition of Gestational Diabetes  States why dietary management is important in controlling blood glucose  Describes the effects of carbohydrates on blood glucose levels  States when to check blood glucose levels  Demonstrates proper blood glucose monitoring techniques  States the effect of stress and exercise on blood glucose levels  Plan:  Aim for 3 Carbohydrate Choices per meal (45 grams) +/- 1 either way  Aim for 1-2 Carbohydrate Choices per snack Begin reading food labels for Total Carbohydrate of foods If OK with your MD, consider  increasing your activity level by walking, Arm Chair Exercises or other activity daily as tolerated Begin checking Blood Glucose before breakfast and 2 hours after first bite of breakfast, lunch and dinner as directed by MD  Bring Log Book/Sheet and meter to every medical appointment Take medication if directed by MD  Blood glucose monitor Rx has already been entered, Patient has not picked it up yet.   Patient instructed to monitor glucose levels: FBS: 60 - 95 mg/dl 2 hour: <120 mg/dl  Patient received the following handouts:  Nutrition Diabetes and Pregnancy  Carbohydrate Counting List  Blood glucose Log Sheet  Patient will be seen for follow-up in as needed.

## 2019-06-02 ENCOUNTER — Telehealth: Payer: Medicaid Other | Admitting: Advanced Practice Midwife

## 2019-06-02 ENCOUNTER — Other Ambulatory Visit: Payer: Self-pay

## 2019-06-03 ENCOUNTER — Other Ambulatory Visit: Payer: Self-pay | Admitting: *Deleted

## 2019-06-03 ENCOUNTER — Telehealth: Payer: Self-pay | Admitting: *Deleted

## 2019-06-03 DIAGNOSIS — O24410 Gestational diabetes mellitus in pregnancy, diet controlled: Secondary | ICD-10-CM

## 2019-06-03 DIAGNOSIS — O0990 Supervision of high risk pregnancy, unspecified, unspecified trimester: Secondary | ICD-10-CM

## 2019-06-03 MED ORDER — ACCU-CHEK GUIDE VI STRP: ORAL_STRIP | 12 refills | Status: DC

## 2019-06-03 MED ORDER — ACCU-CHEK SOFTCLIX LANCETS MISC: 12 refills | Status: DC

## 2019-06-03 MED ORDER — ACCU-CHEK GUIDE ME W/DEVICE KIT: 1.0000 | PACK | Freq: Four times a day (QID) | 0 refills | Status: DC

## 2019-06-03 NOTE — Telephone Encounter (Signed)
Pt states [facility] does not have the guide in stock.  Checked with [facility] and they do.  Prescription sent to [facility].

## 2019-06-08 DIAGNOSIS — K011 Impacted teeth: Secondary | ICD-10-CM | POA: Diagnosis not present

## 2019-06-09 ENCOUNTER — Telehealth: Payer: Medicaid Other | Admitting: Advanced Practice Midwife

## 2019-06-13 ENCOUNTER — Telehealth (INDEPENDENT_AMBULATORY_CARE_PROVIDER_SITE_OTHER): Payer: Medicaid Other | Admitting: Women's Health

## 2019-06-13 ENCOUNTER — Other Ambulatory Visit: Payer: Self-pay

## 2019-06-13 ENCOUNTER — Encounter: Payer: Self-pay | Admitting: Women's Health

## 2019-06-13 VITALS — BP 132/83 | HR 100

## 2019-06-13 DIAGNOSIS — O0990 Supervision of high risk pregnancy, unspecified, unspecified trimester: Secondary | ICD-10-CM

## 2019-06-13 DIAGNOSIS — O24419 Gestational diabetes mellitus in pregnancy, unspecified control: Secondary | ICD-10-CM

## 2019-06-13 DIAGNOSIS — O0993 Supervision of high risk pregnancy, unspecified, third trimester: Secondary | ICD-10-CM

## 2019-06-13 DIAGNOSIS — Z3A33 33 weeks gestation of pregnancy: Secondary | ICD-10-CM

## 2019-06-13 MED ORDER — METFORMIN HCL 500 MG PO TABS: 500.0000 mg | ORAL_TABLET | Freq: Every day | ORAL | 3 refills | Status: DC

## 2019-06-13 NOTE — Patient Instructions (Signed)
Brandon Melnick, I greatly value your feedback.  If you receive a survey following your visit with [place] today, we appreciate you taking the time to fill it out.  Thanks, [name], CNM, [facility]  [facility] HAS MOVED!!! It is now [facility] at [facility] ([address] [place], [place] [address]) Entrance located off of [address] Free 24/7 valet parking    Go to [email or web address] to register for FREE online childbirth classes   Call the office [phone]) or go to [facility] if:  You begin to have strong, frequent contractions  Your water breaks.  Sometimes it is a big gush of fluid, sometimes it is just a trickle that keeps getting your panties wet or running down your legs  You have vaginal bleeding.  It is normal to have a small amount of spotting if your cervix was checked.   You don't feel your baby moving like normal.  If you don't, get you something to eat and drink and lay down and focus on feeling your baby move.  You should feel at least 10 movements in 2 hours.  If you don't, you should call the office or go to [facility].    Tdap Vaccine  It is recommended that you get the Tdap vaccine during the third trimester of EACH pregnancy to help protect your baby from getting pertussis (whooping cough)  27-36 weeks is the BEST time to do this so that you can pass the protection on to your baby. During pregnancy is better than after pregnancy, but if you are unable to get it during pregnancy it will be offered at the hospital.   You can get this vaccine with [place], at the health department, your family doctor, or some local pharmacies  Everyone who will be around your baby should also be up-to-date on their vaccines before the baby comes. Adults (who are not pregnant) only need 1 dose of Tdap during adulthood.   [place] Pediatricians/Family Doctors:  [facility] [phone]            [facility] [phone]                  [facility] [phone] (usually not accepting new patients unless you have family there already, you are always welcome to call and ask)       [facility] [phone]       [facility] Pediatricians/Family Doctors:   [facility]: [phone]  [facility]: [phone]  [facility]: [phone]  [facility] Doctors:   [facility]: [phone]   [facility]: [phone]  [facility] Doctors:  [facility]: [phone]   Home Blood Pressure Monitoring for Patients   Your provider has recommended that you check your blood pressure (BP) at least once a week at home. If you do not have a blood pressure cuff at home, one will be provided for you. Contact your provider if you have not received your monitor within 1 week.   Helpful Tips for Accurate Home Blood Pressure Checks  . Don't smoke, exercise, or drink caffeine 30 minutes before checking your BP . Use the restroom before checking your BP (a full bladder can raise your pressure) . Relax in a comfortable upright chair . Feet on the ground . Left arm resting comfortably on a flat surface at the level of your heart . Legs uncrossed . Back supported . Sit quietly and don't  talk . Place the cuff on your bare arm . Adjust snuggly, so that only two fingertips can fit between your skin and the top of the cuff . Check 2 readings separated by at least one minute . Keep a log of your BP readings . For a visual, please reference this diagram: http://ccnc.care/bpdiagram  Provider Name: [facility]     Phone: [phone]  Zone 1: ALL CLEAR  Continue to monitor your symptoms:  . BP reading is less than 140 (top number) or less than 90 (bottom number)  . No right upper stomach pain . No headaches or seeing spots . No feeling nauseated or throwing up . No swelling in face and  hands  Zone 2: CAUTION Call your doctor's office for any of the following:  . BP reading is greater than 140 (top number) or greater than 90 (bottom number)  . Stomach pain under your ribs in the middle or right side . Headaches or seeing spots . Feeling nauseated or throwing up . Swelling in face and hands  Zone 3: EMERGENCY  Seek immediate medical care if you have any of the following:  . BP reading is greater than160 (top number) or greater than 110 (bottom number) . Severe headaches not improving with Tylenol . Serious difficulty catching your breath . Any worsening symptoms from Zone 2   Third Trimester of Pregnancy The third trimester is from week 29 through week 42, months 7 through 9. The third trimester is a time when the fetus is growing rapidly. At the end of the ninth month, the fetus is about 20 inches in length and weighs 6-10 pounds.  BODY CHANGES Your body goes through many changes during pregnancy. The changes vary from woman to woman.   Your weight will continue to increase. You can expect to gain 25-35 pounds (11-16 kg) by the end of the pregnancy.  You may begin to get stretch marks on your hips, abdomen, and breasts.  You may urinate more often because the fetus is moving lower into your pelvis and pressing on your bladder.  You may develop or continue to have heartburn as a result of your pregnancy.  You may develop constipation because certain hormones are causing the muscles that push waste through your intestines to slow down.  You may develop hemorrhoids or swollen, bulging veins (varicose veins).  You may have pelvic pain because of the weight gain and pregnancy hormones relaxing your joints between the bones in your pelvis. Backaches may result from overexertion of the muscles supporting your posture.  You may have changes in your hair. These can include thickening of your hair, rapid growth, and changes in texture. Some women also have hair loss during  or after pregnancy, or hair that feels dry or thin. Your hair will most likely return to normal after your baby is born.  Your breasts will continue to grow and be tender. A yellow discharge may leak from your breasts called colostrum.  Your belly button may stick out.  You may feel short of breath because of your expanding uterus.  You may notice the fetus "dropping," or moving lower in your abdomen.  You may have a bloody mucus discharge. This usually occurs a few days to a week before labor begins.  Your cervix becomes thin and soft (effaced) near your due date. WHAT TO EXPECT AT YOUR PRENATAL EXAMS  You will have prenatal exams every 2 weeks until week 36. Then, you will have weekly prenatal  exams. During a routine prenatal visit:  You will be weighed to make sure you and the fetus are growing normally.  Your blood pressure is taken.  Your abdomen will be measured to track your baby's growth.  The fetal heartbeat will be listened to.  Any test results from the previous visit will be discussed.  You may have a cervical check near your due date to see if you have effaced. At around 36 weeks, your caregiver will check your cervix. At the same time, your caregiver will also perform a test on the secretions of the vaginal tissue. This test is to determine if a type of bacteria, Group B streptococcus, is present. Your caregiver will explain this further. Your caregiver may ask you:  What your birth plan is.  How you are feeling.  If you are feeling the baby move.  If you have had any abnormal symptoms, such as leaking fluid, bleeding, severe headaches, or abdominal cramping.  If you have any questions. Other tests or screenings that may be performed during your third trimester include:  Blood tests that check for low iron levels (anemia).  Fetal testing to check the health, activity level, and growth of the fetus. Testing is done if you have certain medical conditions or if  there are problems during the pregnancy. FALSE LABOR You may feel small, irregular contractions that eventually go away. These are called Braxton Hicks contractions, or false labor. Contractions may last for hours, days, or even weeks before true labor sets in. If contractions come at regular intervals, intensify, or become painful, it is best to be seen by your caregiver.  SIGNS OF LABOR   Menstrual-like cramps.  Contractions that are 5 minutes apart or less.  Contractions that start on the top of the uterus and spread down to the lower abdomen and back.  A sense of increased pelvic pressure or back pain.  A watery or bloody mucus discharge that comes from the vagina. If you have any of these signs before the 37th week of pregnancy, call your caregiver right away. You need to go to the hospital to get checked immediately. HOME CARE INSTRUCTIONS   Avoid all smoking, herbs, alcohol, and unprescribed drugs. These chemicals affect the formation and growth of the baby.  Follow your caregiver's instructions regarding medicine use. There are medicines that are either safe or unsafe to take during pregnancy.  Exercise only as directed by your caregiver. Experiencing uterine cramps is a good sign to stop exercising.  Continue to eat regular, healthy meals.  Wear a good support bra for breast tenderness.  Do not use hot tubs, steam rooms, or saunas.  Wear your seat belt at all times when driving.  Avoid raw meat, uncooked cheese, cat litter boxes, and soil used by cats. These carry germs that can cause birth defects in the baby.  Take your prenatal vitamins.  Try taking a stool softener (if your caregiver approves) if you develop constipation. Eat more high-fiber foods, such as fresh vegetables or fruit and whole grains. Drink plenty of fluids to keep your urine clear or pale yellow.  Take warm sitz baths to soothe any pain or discomfort caused by hemorrhoids. Use hemorrhoid cream if your  caregiver approves.  If you develop varicose veins, wear support hose. Elevate your feet for 15 minutes, 3-4 times a day. Limit salt in your diet.  Avoid heavy lifting, wear low heal shoes, and practice good posture.  Rest a lot with your legs elevated  if you have leg cramps or low back pain.  Visit your dentist if you have not gone during your pregnancy. Use a soft toothbrush to brush your teeth and be gentle when you floss.  A sexual relationship may be continued unless your caregiver directs you otherwise.  Do not travel far distances unless it is absolutely necessary and only with the approval of your caregiver.  Take prenatal classes to understand, practice, and ask questions about the labor and delivery.  Make a trial run to the hospital.  Pack your hospital bag.  Prepare the baby's nursery.  Continue to go to all your prenatal visits as directed by your caregiver. SEEK MEDICAL CARE IF:  You are unsure if you are in labor or if your water has broken.  You have dizziness.  You have mild pelvic cramps, pelvic pressure, or nagging pain in your abdominal area.  You have persistent nausea, vomiting, or diarrhea.  You have a bad smelling vaginal discharge.  You have pain with urination. SEEK IMMEDIATE MEDICAL CARE IF:   You have a fever.  You are leaking fluid from your vagina.  You have spotting or bleeding from your vagina.  You have severe abdominal cramping or pain.  You have rapid weight loss or gain.  You have shortness of breath with chest pain.  You notice sudden or extreme swelling of your face, hands, ankles, feet, or legs.  You have not felt your baby move in over an hour.  You have severe headaches that do not go away with medicine.  You have vision changes. Document Released: 02/25/2001 Document Revised: 03/08/2013 Document Reviewed: 05/04/2012 [facility] Patient Information 2015 [place], [place]. This information is not intended to replace advice  given to you by your health care provider. Make sure you discuss any questions you have with your health care provider.  PROTECT YOURSELF & YOUR BABY FROM THE FLU! Because you are pregnant, we at [facility], along with the Centers for Disease Control (CDC), recommend that you receive the flu vaccine to protect yourself and your baby from the flu. The flu is more likely to cause severe illness in pregnant women than in women of reproductive age who are not pregnant. Changes in the immune system, heart, and lungs during pregnancy make pregnant women (and women up to two weeks postpartum) more prone to severe illness from flu, including illness resulting in hospitalization. Flu also may be harmful for a pregnant woman's developing baby. A common flu symptom is fever, which may be associated with neural tube defects and other adverse outcomes for a developing baby. Getting vaccinated can also help protect a baby after birth from flu. (Mom passes antibodies onto the developing baby during her pregnancy.)  A Flu Vaccine is the Best Protection Against Flu Getting a flu vaccine is the first and most important step in protecting against flu. Pregnant women should get a flu shot and not the live attenuated influenza vaccine (LAIV), also known as nasal spray flu vaccine. Flu vaccines given during pregnancy help protect both the mother and her baby from flu. Vaccination has been shown to reduce the risk of flu-associated acute respiratory infection in pregnant women by up to one-half. A 2018 study showed that getting a flu shot reduced a pregnant woman's risk of being hospitalized with flu by an average of 40 percent. Pregnant women who get a flu vaccine are also helping to protect their babies from flu illness for the first several months after their birth, when they  are too young to get vaccinated.   A Long Record of Safety for Flu Shots in Pregnant Women Flu shots have been given to millions of pregnant women over  many years with a good safety record. There is a lot of evidence that flu vaccines can be given safely during pregnancy; though these data are limited for the first trimester. The CDC recommends that pregnant women get vaccinated during any trimester of their pregnancy. It is very important for pregnant women to get the flu shot.   Other Preventive Actions In addition to getting a flu shot, pregnant women should take the same everyday preventive actions the CDC recommends of everyone, including covering coughs, washing hands often, and avoiding people who are sick.  Symptoms and Treatment If you get sick with flu symptoms call your doctor right away. There are antiviral drugs that can treat flu illness and prevent serious flu complications. The CDC recommends prompt treatment for people who have influenza infection or suspected influenza infection and who are at high risk of serious flu complications, such as people with asthma, diabetes (including gestational diabetes), or heart disease. Early treatment of influenza in hospitalized pregnant women has been shown to reduce the length of the hospital stay.  Symptoms Flu symptoms include fever, c Sciatica Rehab Ask your health care provider which exercises are safe for you. Do exercises exactly as told by your health care provider and adjust them as directed. It is normal to feel mild stretching, pulling, tightness, or discomfort as you do these exercises. Stop right away if you feel sudden pain or your pain gets worse. Do not begin these exercises until told by your health care provider. Stretching and range-of-motion exercises These exercises warm up your muscles and joints and improve the movement and flexibility of your hips and back. These exercises also help to relieve pain, numbness, and tingling. Sciatic nerve glide 1. Sit in a chair with your head facing down toward your chest. Place your hands behind your back. Let your shoulders slump  forward. 2. Slowly straighten one of your legs while you tilt your head back as if you are looking toward the ceiling. Only straighten your leg as far as you can without making your symptoms worse. 3. Hold this position for __________ seconds. 4. Slowly return to the starting position. 5. Repeat with your other leg. Repeat __________ times. Complete this exercise __________ times a day. Knee to chest with hip adduction and internal rotation  1. Lie on your back on a firm surface with both legs straight. 2. Bend one of your knees and move it up toward your chest until you feel a gentle stretch in your lower back and buttock. Then, move your knee toward the shoulder that is on the opposite side from your leg. This is hip adduction and internal rotation. ? Hold your leg in this position by holding on to the front of your knee. 3. Hold this position for __________ seconds. 4. Slowly return to the starting position. 5. Repeat with your other leg. Repeat __________ times. Complete this exercise __________ times a day. Prone extension on elbows  1. Lie on your abdomen on a firm surface. A bed may be too soft for this exercise. 2. Prop yourself up on your elbows. 3. Use your arms to help lift your chest up until you feel a gentle stretch in your abdomen and your lower back. ? This will place some of your body weight on your elbows. If this is uncomfortable,  try stacking pillows under your chest. ? Your hips should stay down, against the surface that you are lying on. Keep your hip and back muscles relaxed. 4. Hold this position for __________ seconds. 5. Slowly relax your upper body and return to the starting position. Repeat __________ times. Complete this exercise __________ times a day. Strengthening exercises These exercises build strength and endurance in your back. Endurance is the ability to use your muscles for a long time, even after they get tired. Pelvic tilt This exercise strengthens  the muscles that lie deep in the abdomen. 1. Lie on your back on a firm surface. Bend your knees and keep your feet flat on the floor. 2. Tense your abdominal muscles. Tip your pelvis up toward the ceiling and flatten your lower back into the floor. ? To help with this exercise, you may place a small towel under your lower back and try to push your back into the towel. 3. Hold this position for __________ seconds. 4. Let your muscles relax completely before you repeat this exercise. Repeat __________ times. Complete this exercise __________ times a day. Alternating arm and leg raises  1. Get on your hands and knees on a firm surface. If you are on a hard floor, you may want to use padding, such as an exercise mat, to cushion your knees. 2. Line up your arms and legs. Your hands should be directly below your shoulders, and your knees should be directly below your hips. 3. Lift your left leg behind you. At the same time, raise your right arm and straighten it in front of you. ? Do not lift your leg higher than your hip. ? Do not lift your arm higher than your shoulder. ? Keep your abdominal and back muscles tight. ? Keep your hips facing the ground. ? Do not arch your back. ? Keep your balance carefully, and do not hold your breath. 4. Hold this position for __________ seconds. 5. Slowly return to the starting position. 6. Repeat with your right leg and your left arm. Repeat __________ times. Complete this exercise __________ times a day. Posture and body mechanics Good posture and healthy body mechanics can help to relieve stress in your body's tissues and joints. Body mechanics refers to the movements and positions of your body while you do your daily activities. Posture is part of body mechanics. Good posture means:  Your spine is in its natural S-curve position (neutral).  Your shoulders are pulled back slightly.  Your head is not tipped forward. Follow these guidelines to improve  your posture and body mechanics in your everyday activities. Standing   When standing, keep your spine neutral and your feet about hip width apart. Keep a slight bend in your knees. Your ears, shoulders, and hips should line up.  When you do a task in which you stand in one place for a long time, place one foot up on a stable object that is 2-4 inches (5-10 cm) high, such as a footstool. This helps keep your spine neutral. Sitting   When sitting, keep your spine neutral and keep your feet flat on the floor. Use a footrest, if necessary, and keep your thighs parallel to the floor. Avoid rounding your shoulders, and avoid tilting your head forward.  When working at a desk or a computer, keep your desk at a height where your hands are slightly lower than your elbows. Slide your chair under your desk so you are close enough to maintain good posture.  When working at a computer, place your monitor at a height where you are looking straight ahead and you do not have to tilt your head forward or downward to look at the screen. Resting  When lying down and resting, avoid positions that are most painful for you.  If you have pain with activities such as sitting, bending, stooping, or squatting, lie in a position in which your body does not bend very much. For example, avoid curling up on your side with your arms and knees near your chest (fetal position).  If you have pain with activities such as standing for a long time or reaching with your arms, lie with your spine in a neutral position and bend your knees slightly. Try the following positions: ? Lying on your side with a pillow between your knees. ? Lying on your back with a pillow under your knees. Lifting   When lifting objects, keep your feet at least shoulder width apart and tighten your abdominal muscles.  Bend your knees and hips and keep your spine neutral. It is important to lift using the strength of your legs, not your back. Do not  lock your knees straight out.  Always ask for help to lift heavy or awkward objects. This information is not intended to replace advice given to you by your health care provider. Make sure you discuss any questions you have with your health care provider. Document Revised: 06/25/2018 Document Reviewed: 03/25/2018 [employer] Patient Education  [place], sore throat, runny or stuffy nose, body aches, headache, chills and fatigue. Some people may also have vomiting and diarrhea. People may be infected with the flu and have respiratory symptoms without a fever.  Early Treatment is Important for Pregnant Women Treatment should begin as soon as possible because antiviral drugs work best when started early (within 48 hours after symptoms start). Antiviral drugs can make your flu illness milder and make you feel better faster. They may also prevent serious health problems that can result from flu illness. Oral oseltamivir (Tamiflu) is the preferred treatment for pregnant women because it has the most studies available to suggest that it is safe and beneficial. Antiviral drugs require a prescription from your provider. Having a fever caused by flu infection or other infections early in pregnancy may be linked to birth defects in a baby. In addition to taking antiviral drugs, pregnant women who get a fever should treat their fever with Tylenol (acetaminophen) and contact their provider immediately.  When to [place] If you are pregnant and have any of these signs, seek care immediately:  Difficulty breathing or shortness of breath  Pain or pressure in the chest or abdomen  Sudden dizziness  Confusion  Severe or persistent vomiting  High fever that is not responding to Tylenol (or store brand equivalent)  Decreased or no movement of your baby  [email or web address]

## 2019-06-13 NOTE — Progress Notes (Signed)
   TELEHEALTH VIRTUAL OBSTETRICS VISIT ENCOUNTER NOTE Patient name: Sara [name] MRN [id number]  Date of birth: 1995-04-20  I connected with patient on 06/13/19 at  2:30 PM EDT by MyChart video  and verified that I am speaking with the correct person using two identifiers. Due to COVID-19 recommendations, pt is not currently in our office.    I discussed the limitations, risks, security and privacy concerns of performing an evaluation and management service by telephone and the availability of in person appointments. I also discussed with the patient that there may be a patient responsible charge related to this service. The patient expressed understanding and agreed to proceed.  Chief Complaint:   Routine Prenatal Visit  History of Present Illness:   Sara [name] is a 24 y.o. G47P1001 female at [redacted]w[redacted]d with an Estimated Date of Delivery: 07/30/19 being evaluated today for ongoing management of a high-risk pregnancy complicated by A1DM.  Today she reports all < 95 until this morning, was 124. 2hr pp 108-140s (most >120). . Contractions: Not present. Vag. Bleeding: None.  Movement: Present. denies leaking of fluid. Review of Systems:   Pertinent items are noted in HPI Denies abnormal vaginal discharge w/ itching/odor/irritation, headaches, visual changes, shortness of breath, chest pain, abdominal pain, severe nausea/vomiting, or problems with urination or bowel movements unless otherwise stated above. Pertinent History Reviewed:  Reviewed past medical,surgical, social, obstetrical and family history.  Reviewed problem list, medications and allergies. Physical Assessment:   Vitals:   06/13/19 1439  BP: 132/83  Pulse: 100  There is no height or weight on file to calculate BMI.        Physical Examination:   General:  Alert, oriented and cooperative.   Mental Status: Normal mood and affect perceived. Normal judgment and thought content.  Rest of physical exam deferred due to type of  encounter  No results found for this or any previous visit (from the past 24 hour(s)).  Assessment & Plan:  1) Pregnancy G2P1001 at [redacted]w[redacted]d with an Estimated Date of Delivery: 07/30/19   2) A2DM, unstable, rx metformin 500mg  AM   Meds:  Meds ordered this encounter  Medications  . metFORMIN (GLUCOPHAGE) 500 MG tablet    Sig: Take 1 tablet (500 mg total) by mouth daily with breakfast.    Dispense:  30 tablet    Refill:  3    Order Specific Question:   Supervising Provider    Answer:   [[id number]]    Labs/procedures today: none  Plan:  Continue routine obstetrical care.  Has home bp cuff.  Check bp weekly, let Lazaro Arms know if >140/90.  Next visit: prefers will be in person for nst    Reviewed: Preterm labor symptoms and general obstetric precautions including but not limited to vaginal bleeding, contractions, leaking of fluid and fetal movement were reviewed in detail with the patient. The patient was advised to call back or seek an in-person office evaluation/go to [facility] at [facility] for any urgent or concerning symptoms. All questions were answered. Please refer to After Visit Summary for other counseling recommendations.    I provided 15 minutes of non-face-to-face time during this encounter.  Follow-up: Return for Fri nst/rn, Tues hrob/nst w/ md/cnm, efw/bpp on 4/6 & 4/27 instead of nst.  Orders Placed This Encounter  Procedures  . 5/27 OB Follow Up  . US FETAL BPP WO NON STRESS   [place] CNM, [facility] 06/13/2019 3:23 PM

## 2019-06-16 ENCOUNTER — Other Ambulatory Visit: Payer: Self-pay

## 2019-06-16 ENCOUNTER — Ambulatory Visit (INDEPENDENT_AMBULATORY_CARE_PROVIDER_SITE_OTHER): Payer: Medicaid Other | Admitting: *Deleted

## 2019-06-16 VITALS — BP 131/78 | HR 86 | Wt 156.0 lb

## 2019-06-16 DIAGNOSIS — Z1389 Encounter for screening for other disorder: Secondary | ICD-10-CM

## 2019-06-16 DIAGNOSIS — O0990 Supervision of high risk pregnancy, unspecified, unspecified trimester: Secondary | ICD-10-CM

## 2019-06-16 DIAGNOSIS — Z3A33 33 weeks gestation of pregnancy: Secondary | ICD-10-CM

## 2019-06-16 DIAGNOSIS — Z331 Pregnant state, incidental: Secondary | ICD-10-CM

## 2019-06-16 DIAGNOSIS — O24419 Gestational diabetes mellitus in pregnancy, unspecified control: Secondary | ICD-10-CM

## 2019-06-16 LAB — POCT URINALYSIS DIPSTICK OB
Blood, UA: NEGATIVE
Ketones, UA: NEGATIVE
Leukocytes, UA: NEGATIVE
Nitrite, UA: NEGATIVE
POC,PROTEIN,UA: NEGATIVE

## 2019-06-16 NOTE — Progress Notes (Signed)
   NURSE VISIT- NST  SUBJECTIVE:  Sara [name] is a 24 y.o. G2P1001 female at [redacted]w[redacted]d, here for a NST for pregnancy complicated by A2DM currently on Metformin.  She reports active fetal movement, contractions: none, vaginal bleeding: none, membranes: intact.   OBJECTIVE:  LMP 10/23/2018   Appears well, no apparent distress  No results found for this or any previous visit (from the past 24 hour(s)).  NST: FHR baseline 145 bpm, Variability: moderate, Accelerations:present, Decelerations:  Absent= Cat 1/Reactive Toco: none   ASSESSMENT: G2P1001 at [redacted]w[redacted]d with A2DM currently on Metformin NST reactive  PLAN: EFM strip reviewed by [name], CNM, [facility]   Recommendations: keep next appointment as scheduled    Jobe Marker  06/16/2019 2:40 PM

## 2019-06-21 ENCOUNTER — Other Ambulatory Visit: Payer: Self-pay

## 2019-06-21 ENCOUNTER — Ambulatory Visit (INDEPENDENT_AMBULATORY_CARE_PROVIDER_SITE_OTHER): Payer: Medicaid Other | Admitting: Obstetrics and Gynecology

## 2019-06-21 ENCOUNTER — Ambulatory Visit (INDEPENDENT_AMBULATORY_CARE_PROVIDER_SITE_OTHER): Payer: Medicaid Other

## 2019-06-21 ENCOUNTER — Telehealth: Payer: Self-pay | Admitting: Obstetrics and Gynecology

## 2019-06-21 VITALS — BP 122/74 | HR 95 | Wt 155.8 lb

## 2019-06-21 DIAGNOSIS — O24429 Gestational diabetes mellitus in childbirth, unspecified control: Secondary | ICD-10-CM

## 2019-06-21 DIAGNOSIS — O0993 Supervision of high risk pregnancy, unspecified, third trimester: Secondary | ICD-10-CM

## 2019-06-21 DIAGNOSIS — O24419 Gestational diabetes mellitus in pregnancy, unspecified control: Secondary | ICD-10-CM

## 2019-06-21 DIAGNOSIS — O0990 Supervision of high risk pregnancy, unspecified, unspecified trimester: Secondary | ICD-10-CM

## 2019-06-21 DIAGNOSIS — Z3A34 34 weeks gestation of pregnancy: Secondary | ICD-10-CM

## 2019-06-21 DIAGNOSIS — Z1389 Encounter for screening for other disorder: Secondary | ICD-10-CM

## 2019-06-21 DIAGNOSIS — O24415 Gestational diabetes mellitus in pregnancy, controlled by oral hypoglycemic drugs: Secondary | ICD-10-CM | POA: Diagnosis not present

## 2019-06-21 DIAGNOSIS — Z331 Pregnant state, incidental: Secondary | ICD-10-CM

## 2019-06-21 LAB — POCT URINALYSIS DIPSTICK OB
Blood, UA: NEGATIVE
Glucose, UA: NEGATIVE
Ketones, UA: NEGATIVE
Leukocytes, UA: NEGATIVE
Nitrite, UA: NEGATIVE
POC,PROTEIN,UA: NEGATIVE

## 2019-06-21 NOTE — Progress Notes (Signed)
[place] 34+3 wks,cephalic,BPP 8/8,FHR 148 BPM,posterior placenta gr 3,afi 11 cm,efw 2251 g 25%

## 2019-06-21 NOTE — Progress Notes (Signed)
Patient ID: TAYLA PANOZZO, female   DOB: 1995/07/19, 24 y.o.   MRN: [id number]     LOW-RISK PREGNANCY VISIT Patient name: Sara [name] MRN [id number]  Date of birth: Jan 02, 1996   Chief Complaint:   Gynecologic Exam (Ultrasound)  History of Present Illness:   Sara [name] is a 24 y.o. G8P1001 female at 23w3dwith an Estimated Date of Delivery: 07/30/19 being seen today for ongoing management of a hi risk pregnancy due to A2DM  Fasting blood sugar between 92 and 129 with 6 out of 10 over 100. Over half of PC's over 120 at 2 hr pc. She has met with a dietitian. She is eating a lot of carbohydrates and is finding difficulty staying away from them. She doesn't like a lot of vegetables, but she likes to eat fruit.  Father is diabetic. She lives with him. FOB involved. [facility] 01/20/2019 02/16/2017 12/22/2016  Decreased Interest 0 1 2  Down, Depressed, Hopeless 0 3 2  PHQ - 2 Score 0 4 4  Altered sleeping 1 0 1  Tired, decreased energy _0 Change in appetite 0 1 0  Feeling bad or failure about yourself  0 1 1  Trouble concentrating 0 0 0  Moving slowly or fidgety/restless 0 0 0  Suicidal thoughts 0 0 0  PHQ-9 Score _1 Difficult doing work/chores - Somewhat difficult -    Today she reports no complaints. Contractions: Not present.  .  Movement: Present. denies leaking of fluid.  Review of Systems:   Pertinent items are noted in HPI Denies abnormal vaginal discharge w/ itching/odor/irritation, headaches, visual changes, shortness of breath, chest pain, abdominal pain, severe nausea/vomiting, or problems with urination or bowel movements unless otherwise stated above.  Pertinent History Reviewed:  Reviewed past medical,surgical, social, obstetrical and family history.  Reviewed problem list, medications and allergies.  Physical Assessment:   Vitals:   06/21/19 1409  BP: 122/74  Pulse: 95  Weight: 155 lb 12.8 oz (70.7 kg)  Body mass index is 27.6 kg/m.        Physical Examination:   General appearance: Well appearing, and in no distress  Mental status: Alert, oriented to person, place, and time  Skin: Warm & dry  Cardiovascular: Normal heart rate noted  Respiratory: Normal respiratory effort, no distress  Abdomen: Soft, gravid, nontender   34 cm  Pelvic: Cervical exam deferred         Extremities: Edema: None  Fetal Status:     Movement: Present    Chaperone: ABiomedical scientist   Results for orders placed or performed in visit on 05/12/19 (from the past 24 hour(s))  POC Urinalysis Dipstick OB   Collection Time: 06/21/19  2:08 PM  Result Value Ref Range   Color, UA     Clarity, UA     Glucose, UA Negative Negative   Bilirubin, UA     Ketones, UA n    Spec Grav, UA     Blood, UA n    pH, UA     POC,PROTEIN,UA Negative Negative, Trace, Small (1+), Moderate (2+), Large (3+), 4+   Urobilinogen, UA     Nitrite, UA n    Leukocytes, UA Negative Negative   Appearance     Odor      Assessment & Plan:  1) Low-risk pregnancy G2P1001 at 355w3dith an Estimated Date of Delivery: 07/30/19   2) NST on Tuesday (04/13) and Friday (04/16)  Meds: No orders of the defined types were placed in this encounter.  Labs/procedures today: BPP 8/8 EFW 25% for GA  Plan:  Continue monitroed obstetrical care biweekly NST's next week Next visit: prefers in person    Reviewed: Term labor symptoms and general obstetric precautions including but not limited to vaginal bleeding, contractions, leaking of fluid and fetal movement were reviewed in detail with the patient.  All questions were answered. has home bp cuff. . Check bp weekly, let us know if >140/90.  BS check qid INCREASE METFORMIN TO 500 BID.   Follow-up: NST BIWEEKLY  Orders Placed This Encounter  Procedures  . POC Urinalysis Dipstick OB     By signing my name below, I, [employer], attest that this documentation has been prepared under the direction and in the presence of Jonnie Kind,  MD. Electronically Signed: [place]. 06/21/19. 1:33 PM.  I personally performed the services described in this documentation, which was SCRIBED in my presence. The recorded information has been reviewed and considered accurate. It has been edited as necessary during review. Jonnie Kind, MD

## 2019-06-21 NOTE — Telephone Encounter (Signed)
Pt reminded to increase to Metformin 500 bid.

## 2019-06-24 ENCOUNTER — Other Ambulatory Visit: Payer: Medicaid Other

## 2019-06-28 ENCOUNTER — Other Ambulatory Visit: Payer: Medicaid Other | Admitting: Obstetrics & Gynecology

## 2019-07-01 ENCOUNTER — Other Ambulatory Visit: Payer: Self-pay

## 2019-07-01 ENCOUNTER — Ambulatory Visit (INDEPENDENT_AMBULATORY_CARE_PROVIDER_SITE_OTHER): Payer: Medicaid Other | Admitting: Women's Health

## 2019-07-01 ENCOUNTER — Encounter: Payer: Self-pay | Admitting: Women's Health

## 2019-07-01 ENCOUNTER — Other Ambulatory Visit: Payer: Medicaid Other

## 2019-07-01 VITALS — BP 126/83 | HR 110 | Wt 158.8 lb

## 2019-07-01 DIAGNOSIS — Z331 Pregnant state, incidental: Secondary | ICD-10-CM

## 2019-07-01 DIAGNOSIS — Z3A35 35 weeks gestation of pregnancy: Secondary | ICD-10-CM | POA: Diagnosis not present

## 2019-07-01 DIAGNOSIS — O24419 Gestational diabetes mellitus in pregnancy, unspecified control: Secondary | ICD-10-CM | POA: Diagnosis not present

## 2019-07-01 DIAGNOSIS — Z23 Encounter for immunization: Secondary | ICD-10-CM

## 2019-07-01 DIAGNOSIS — O0993 Supervision of high risk pregnancy, unspecified, third trimester: Secondary | ICD-10-CM

## 2019-07-01 DIAGNOSIS — O0990 Supervision of high risk pregnancy, unspecified, unspecified trimester: Secondary | ICD-10-CM

## 2019-07-01 DIAGNOSIS — Z1389 Encounter for screening for other disorder: Secondary | ICD-10-CM

## 2019-07-01 LAB — POCT URINALYSIS DIPSTICK OB
Ketones, UA: NEGATIVE
Nitrite, UA: NEGATIVE
POC,PROTEIN,UA: NEGATIVE

## 2019-07-01 MED ORDER — METFORMIN HCL 1000 MG PO TABS: 1000.0000 mg | ORAL_TABLET | Freq: Two times a day (BID) | ORAL | 1 refills | Status: DC

## 2019-07-01 NOTE — Patient Instructions (Signed)
Sara [name], I greatly value your feedback.  If you receive a survey following your visit with [place] today, we appreciate you taking the time to fill it out.  Thanks, Sara [name], CNM, WHNP-BC  [facility] ([address] [place], [place] [address]) Entrance C, located off of [address] valet parking   Go to [email or web address] to register for FREE online childbirth classes    Call the office [phone]) or go to [facility] if:  You begin to have strong, frequent contractions  Your water breaks.  Sometimes it is a big gush of fluid, sometimes it is just a trickle that keeps getting your panties wet or running down your legs  You have vaginal bleeding.  It is normal to have a small amount of spotting if your cervix was checked.   You don't feel your baby moving like normal.  If you don't, get you something to eat and drink and lay down and focus on feeling your baby move.  You should feel at least 10 movements in 2 hours.  If you don't, you should call the office or go to [facility].   Call the office [phone]) or go to [facility] hospital for these signs of pre-eclampsia:  Severe headache that does not go away with Tylenol  Visual changes- seeing spots, double, blurred vision  Pain under your right breast or upper abdomen that does not go away with Tums or heartburn medicine  Nausea and/or vomiting  Severe swelling in your hands, feet, and face    Home Blood Pressure Monitoring for Patients   Your provider has recommended that you check your blood pressure (BP) at least once a week at home. If you do not have a blood pressure cuff at home, one will be provided for you. Contact your provider if you have not received your monitor within 1 week.   Helpful Tips for Accurate Home Blood Pressure Checks  . Don't smoke, exercise, or drink caffeine 30 minutes before checking your BP . Use the restroom before checking your BP (a full bladder  can raise your pressure) . Relax in a comfortable upright chair . Feet on the ground . Left arm resting comfortably on a flat surface at the level of your heart . Legs uncrossed . Back supported . Sit quietly and don't talk . Place the cuff on your bare arm . Adjust snuggly, so that only two fingertips can fit between your skin and the top of the cuff . Check 2 readings separated by at least one minute . Keep a log of your BP readings . For a visual, please reference this diagram: http://ccnc.care/bpdiagram  Provider Name: [facility]     Phone: [phone]  Zone 1: ALL CLEAR  Continue to monitor your symptoms:  . BP reading is less than 140 (top number) or less than 90 (bottom number)  . No right upper stomach pain . No headaches or seeing spots . No feeling nauseated or throwing up . No swelling in face and hands  Zone 2: CAUTION Call your doctor's office for any of the following:  . BP reading is greater than 140 (top number) or greater than 90 (bottom number)  . Stomach pain under your ribs in the middle or right side . Headaches or seeing spots . Feeling nauseated or throwing up . Swelling in face and hands  Zone 3: EMERGENCY  Seek immediate medical care if you have any of  the following:  . BP reading is greater than160 (top number) or greater than 110 (bottom number) . Severe headaches not improving with Tylenol . Serious difficulty catching your breath . Any worsening symptoms from Zone 2  Preterm Labor and Birth Information  The normal length of a pregnancy is 39-41 weeks. Preterm labor is when labor starts before 37 completed weeks of pregnancy. What are the risk factors for preterm labor? Preterm labor is more likely to occur in women who:  Have certain infections during pregnancy such as a bladder infection, sexually transmitted infection, or infection inside the uterus (chorioamnionitis).  Have a shorter-than-normal cervix.  Have gone into preterm  labor before.  Have had surgery on their cervix.  Are younger than age 24 or older than age 36.  Are African American.  Are pregnant with twins or multiple babies (multiple gestation).  Take street drugs or smoke while pregnant.  Do not gain enough weight while pregnant.  Became pregnant shortly after having been pregnant. What are the symptoms of preterm labor? Symptoms of preterm labor include:  Cramps similar to those that can happen during a menstrual period. The cramps may happen with diarrhea.  Pain in the abdomen or lower back.  Regular uterine contractions that may feel like tightening of the abdomen.  A feeling of increased pressure in the pelvis.  Increased watery or bloody mucus discharge from the vagina.  Water breaking (ruptured amniotic sac). Why is it important to recognize signs of preterm labor? It is important to recognize signs of preterm labor because babies who are born prematurely may not be fully developed. This can put them at an increased risk for:  Long-term (chronic) heart and lung problems.  Difficulty immediately after birth with regulating body systems, including blood sugar, body temperature, heart rate, and breathing rate.  Bleeding in the brain.  Cerebral palsy.  Learning difficulties.  Death. These risks are highest for babies who are born before 43 weeks of pregnancy. How is preterm labor treated? Treatment depends on the length of your pregnancy, your condition, and the health of your baby. It may involve: 1. Having a stitch (suture) placed in your cervix to prevent your cervix from opening too early (cerclage). 2. Taking or being given medicines, such as: ? Hormone medicines. These may be given early in pregnancy to help support the pregnancy. ? Medicine to stop contractions. ? Medicines to help mature the baby's lungs. These may be prescribed if the risk of delivery is high. ? Medicines to prevent your baby from developing  cerebral palsy. If the labor happens before 34 weeks of pregnancy, you may need to stay in the hospital. What should I do if I think I am in preterm labor? If you think that you are going into preterm labor, call your health care provider right away. How can I prevent preterm labor in future pregnancies? To increase your chance of having a full-term pregnancy:  Do not use any tobacco products, such as cigarettes, chewing tobacco, and e-cigarettes. If you need help quitting, ask your health care provider.  Do not use street drugs or medicines that have not been prescribed to you during your pregnancy.  Talk with your health care provider before taking any herbal supplements, even if you have been taking them regularly.  Make sure you gain a healthy amount of weight during your pregnancy.  Watch for infection. If you think that you might have an infection, get it checked right away.  Make sure to  tell your health care provider if you have gone into preterm labor before. This information is not intended to replace advice given to you by your health care provider. Make sure you discuss any questions you have with your health care provider. Document Revised: 06/25/2018 Document Reviewed: 07/25/2015 Elsevier Patient Education  [place].

## 2019-07-01 NOTE — Progress Notes (Signed)
HIGH-RISK PREGNANCY VISIT Patient name: Sara [name] MRN [id number]  Date of birth: 1995-11-05 Chief Complaint:   High Risk Gestation (NST)  History of Present Illness:   KERIN KREN is a 24 y.o. G40P1001 female at [redacted]w[redacted]d with an Estimated Date of Delivery: 07/30/19 being seen today for ongoing management of a high-risk pregnancy complicated by diabetes mellitus A2DM currently on metformin 500mg  BID.  Today she reports FBS 91-107 (majority >95), 2hr pp 104-147 (majority >120) . Eats ice cream at night, still eating things she shouldn't.  Depression screen [facility] 2/9 01/20/2019 02/16/2017 12/22/2016  Decreased Interest 0 1 2  Down, Depressed, Hopeless 0 3 2  PHQ - 2 Score 0 4 4  Altered sleeping 1 0 1  Tired, decreased energy 2 2 1   Change in appetite 0 1 0  Feeling bad or failure about yourself  0 1 1  Trouble concentrating 0 0 0  Moving slowly or fidgety/restless 0 0 0  Suicidal thoughts 0 0 0  PHQ-9 Score 3 8 7   Difficult doing work/chores - Somewhat difficult -    Contractions: Not present. Vag. Bleeding: None.  Movement: Present. denies leaking of fluid.  Review of Systems:   Pertinent items are noted in HPI Denies abnormal vaginal discharge w/ itching/odor/irritation, headaches, visual changes, shortness of breath, chest pain, abdominal pain, severe nausea/vomiting, or problems with urination or bowel movements unless otherwise stated above. Pertinent History Reviewed:  Reviewed past medical,surgical, social, obstetrical and family history.  Reviewed problem list, medications and allergies. Physical Assessment:   Vitals:   07/01/19 1239  BP: 126/83  Pulse: (!) 110  Weight: 158 lb 12.8 oz (72 kg)  Body mass index is 28.13 kg/m.           Physical Examination:   General appearance: alert, well appearing, and in no distress  Mental status: alert, oriented to person, place, and time  Skin: warm & dry   Extremities: Edema: None    Cardiovascular: normal heart rate  noted  Respiratory: normal respiratory effort, no distress  Abdomen: gravid, soft, non-tender  Pelvic: Cervical exam deferred         Fetal Status: Fetal Heart Rate (bpm): 140   Movement: Present    Fetal Surveillance Testing today: NST: FHR baseline 140 bpm, Variability: moderate, Accelerations:present, Decelerations:  Absent= Cat 1/Reactive Toco: none     Chaperone: n/a    Results for orders placed or performed in visit on 07/01/19 (from the past 24 hour(s))  POC Urinalysis Dipstick OB   Collection Time: 07/01/19 12:41 PM  Result Value Ref Range   Color, UA     Clarity, UA     Glucose, UA 4+ (A) Negative   Bilirubin, UA     Ketones, UA neg    Spec Grav, UA     Blood, UA 2+    pH, UA     POC,PROTEIN,UA Negative Negative, Trace, Small (1+), Moderate (2+), Large (3+), 4+   Urobilinogen, UA     Nitrite, UA neg    Leukocytes, UA Trace (A) Negative   Appearance     Odor      Assessment & Plan:  1) High-risk pregnancy G2P1001 at [redacted]w[redacted]d with an Estimated Date of Delivery: 07/30/19   2) A2DM, unstable, increase metformin to 1,000mg  BID, new rx sent. EFW 25% @ 34wks. Discussed importance of strict glycemic control and adherence to low carb diet during pregnancy as well as potential complications from uncontrolled diabetes during pregnancy.  Meds:  Meds ordered this encounter  Medications  . metFORMIN (GLUCOPHAGE) 1000 MG tablet    Sig: Take 1 tablet (1,000 mg total) by mouth 2 (two) times daily with a meal.    Dispense:  60 tablet    Refill:  1    Order Specific Question:   Supervising Provider    Answer:   Florian Buff [[id number]]    Labs/procedures today: nst, tdap  Treatment Plan:  Growth u/s @ 37wks      2x/wk testing @ 32wks          Deliver @ 39wks  Reviewed: Preterm labor symptoms and general obstetric precautions including but not limited to vaginal bleeding, contractions, leaking of fluid and fetal movement were reviewed in detail with the patient.  All questions were  answered.   Follow-up: Return for As scheduled Tues hrob/nst.  Orders Placed This Encounter  Procedures  . Tdap vaccine greater than or equal to 7yo IM  . POC Urinalysis Dipstick OB   Roma Schanz CNM, [facility] 07/01/2019 1:35 PM

## 2019-07-05 ENCOUNTER — Encounter: Payer: Self-pay | Admitting: Obstetrics & Gynecology

## 2019-07-05 ENCOUNTER — Ambulatory Visit (INDEPENDENT_AMBULATORY_CARE_PROVIDER_SITE_OTHER): Payer: Medicaid Other | Admitting: Obstetrics & Gynecology

## 2019-07-05 ENCOUNTER — Other Ambulatory Visit: Payer: Self-pay

## 2019-07-05 ENCOUNTER — Other Ambulatory Visit (HOSPITAL_COMMUNITY)
Admission: RE | Admit: 2019-07-05 | Discharge: 2019-07-05 | Disposition: A | Discharge status: Home / Self Care | Payer: Medicaid Other | Source: Ambulatory Visit | Attending: Obstetrics & Gynecology | Admitting: Obstetrics & Gynecology

## 2019-07-05 VITALS — BP 133/88 | HR 92 | Wt 161.0 lb

## 2019-07-05 DIAGNOSIS — Z113 Encounter for screening for infections with a predominantly sexual mode of transmission: Secondary | ICD-10-CM | POA: Diagnosis not present

## 2019-07-05 DIAGNOSIS — O24419 Gestational diabetes mellitus in pregnancy, unspecified control: Secondary | ICD-10-CM

## 2019-07-05 DIAGNOSIS — O0990 Supervision of high risk pregnancy, unspecified, unspecified trimester: Secondary | ICD-10-CM | POA: Diagnosis not present

## 2019-07-05 DIAGNOSIS — Z3A36 36 weeks gestation of pregnancy: Secondary | ICD-10-CM

## 2019-07-05 DIAGNOSIS — Z1389 Encounter for screening for other disorder: Secondary | ICD-10-CM

## 2019-07-05 DIAGNOSIS — R319 Hematuria, unspecified: Secondary | ICD-10-CM

## 2019-07-05 DIAGNOSIS — E119 Type 2 diabetes mellitus without complications: Secondary | ICD-10-CM | POA: Diagnosis not present

## 2019-07-05 DIAGNOSIS — Z331 Pregnant state, incidental: Secondary | ICD-10-CM

## 2019-07-05 DIAGNOSIS — O24313 Unspecified pre-existing diabetes mellitus in pregnancy, third trimester: Secondary | ICD-10-CM | POA: Diagnosis not present

## 2019-07-05 LAB — POCT URINALYSIS DIPSTICK OB
Glucose, UA: NEGATIVE
Ketones, UA: NEGATIVE
Nitrite, UA: NEGATIVE
POC,PROTEIN,UA: NEGATIVE

## 2019-07-05 NOTE — Progress Notes (Signed)
HIGH-RISK PREGNANCY VISIT Patient name: Sara [name] MRN [id number]  Date of birth: 1996-01-26 Chief Complaint:   Routine Prenatal Visit (NST)  History of Present Illness:   Sara [name] is a 24 y.o. G81P1001 female at [redacted]w[redacted]d with an Estimated Date of Delivery: 07/30/19 being seen today for ongoing management of a high-risk pregnancy complicated by Class A2 DM on metformin 1000 mg BID.  Today she reports dysuria.  Depression screen [facility] 2/9 01/20/2019 02/16/2017 12/22/2016  Decreased Interest 0 1 2  Down, Depressed, Hopeless 0 3 2  PHQ - 2 Score 0 4 4  Altered sleeping 1 0 1  Tired, decreased energy 2 2 1   Change in appetite 0 1 0  Feeling bad or failure about yourself  0 1 1  Trouble concentrating 0 0 0  Moving slowly or fidgety/restless 0 0 0  Suicidal thoughts 0 0 0  PHQ-9 Score 3 8 7   Difficult doing work/chores - Somewhat difficult -    Contractions: Not present. Vag. Bleeding: None.  Movement: Present. denies leaking of fluid.  Review of Systems:   Pertinent items are noted in HPI Denies abnormal vaginal discharge w/ itching/odor/irritation, headaches, visual changes, shortness of breath, chest pain, abdominal pain, severe nausea/vomiting, or problems with urination or bowel movements unless otherwise stated above. Pertinent History Reviewed:  Reviewed past medical,surgical, social, obstetrical and family history.  Reviewed problem list, medications and allergies. Physical Assessment:   Vitals:   07/05/19 1508  BP: 133/88  Pulse: 92  Weight: 161 lb (73 kg)  Body mass index is 28.52 kg/m.           Physical Examination:   General appearance: alert, well appearing, and in no distress  Mental status: alert, oriented to person, place, and time  Skin: warm & dry   Extremities: Edema: None    Cardiovascular: normal heart rate noted  Respiratory: normal respiratory effort, no distress  Abdomen: gravid, soft, non-tender  Pelvic: Cervical exam performed  Dilation: Closed  Effacement (%): Thick Station: -3  Fetal Status: Fetal Heart Rate (bpm): 140 Fundal Height: 36 cm Movement: Present Presentation: Vertex  Fetal Surveillance Testing today: reactive NST  Sara [name] is at [redacted]w[redacted]d Estimated Date of Delivery: 07/30/19  NST being performed due to Class A2 DM  Today the NST is Reactive  Fetal Monitoring:  Baseline: 140 bpm, Variability: Good {> 6 bpm), Accelerations: Reactive and Decelerations: Absent   reactive  The accelerations are >15 bpm and more than 2 in 20 minutes  Final diagnosis:  Reactive NST  [redacted]w[redacted]d, MD      Chaperone: 08/01/19    Results for orders placed or performed in visit on 07/05/19 (from the past 24 hour(s))  POC Urinalysis Dipstick OB   Collection Time: 07/05/19  3:07 PM  Result Value Ref Range   Color, UA     Clarity, UA     Glucose, UA Negative Negative   Bilirubin, UA     Ketones, UA n    Spec Grav, UA     Blood, UA moderate    pH, UA     POC,PROTEIN,UA Negative Negative, Trace, Small (1+), Moderate (2+), Large (3+), 4+   Urobilinogen, UA     Nitrite, UA n    Leukocytes, UA Moderate (2+) (A) Negative   Appearance     Odor      Assessment & Plan:  1) High-risk pregnancy G2P1001 at [redacted]w[redacted]d with an Estimated Date of Delivery: 07/30/19  2) Class A2 DM, stable, good control on metformin 1000 BID EFW 25%  3) ,   Meds: No orders of the defined types were placed in this encounter.   Labs/procedures today: reactive NST  Treatment Plan:  Twice weekly surveillance, sonogram alternating with NST, induction at 39 weeks or as clinically indicated  Reviewed: Term labor symptoms and general obstetric precautions including but not limited to vaginal bleeding, contractions, leaking of fluid and fetal movement were reviewed in detail with the patient.  All questions were answered. Has home bp cuff. Rx faxed to has. Check bp weekly, let us know if >140/90.   Follow-up: Return in about 3 days (around 07/08/2019) for  NST, Nurse only.  Orders Placed This Encounter  Procedures  . Strep Gp B NAA  . Urine Culture  . POC Urinalysis Dipstick OB   Mertie Clause Keerthana Vanrossum  07/05/2019 4:20 PM

## 2019-07-07 LAB — URINE CULTURE

## 2019-07-07 LAB — CERVICOVAGINAL ANCILLARY ONLY
Chlamydia: NEGATIVE
Comment: NEGATIVE
Comment: NORMAL
Neisseria Gonorrhea: NEGATIVE

## 2019-07-07 LAB — STREP GP B NAA: Strep Gp B NAA: NEGATIVE

## 2019-07-08 ENCOUNTER — Other Ambulatory Visit: Payer: Self-pay

## 2019-07-08 ENCOUNTER — Ambulatory Visit (INDEPENDENT_AMBULATORY_CARE_PROVIDER_SITE_OTHER): Payer: Medicaid Other | Admitting: *Deleted

## 2019-07-08 VITALS — BP 126/78 | HR 95 | Wt 158.0 lb

## 2019-07-08 DIAGNOSIS — Z3A36 36 weeks gestation of pregnancy: Secondary | ICD-10-CM | POA: Diagnosis not present

## 2019-07-08 DIAGNOSIS — Z1389 Encounter for screening for other disorder: Secondary | ICD-10-CM

## 2019-07-08 DIAGNOSIS — O24419 Gestational diabetes mellitus in pregnancy, unspecified control: Secondary | ICD-10-CM

## 2019-07-08 DIAGNOSIS — O0990 Supervision of high risk pregnancy, unspecified, unspecified trimester: Secondary | ICD-10-CM

## 2019-07-08 DIAGNOSIS — Z331 Pregnant state, incidental: Secondary | ICD-10-CM

## 2019-07-08 LAB — POCT URINALYSIS DIPSTICK OB
Blood, UA: NEGATIVE
Glucose, UA: NEGATIVE
Ketones, UA: NEGATIVE
Leukocytes, UA: NEGATIVE
Nitrite, UA: NEGATIVE
POC,PROTEIN,UA: NEGATIVE

## 2019-07-08 NOTE — Progress Notes (Signed)
   NURSE VISIT- NST  SUBJECTIVE:  Sara [name] is a 24 y.o. G2P1001 female at [redacted]w[redacted]d, here for a NST for pregnancy complicated by A2DM currently on Metformin 1000 mg BID.  She reports active fetal movement, contractions: none, vaginal bleeding: none, membranes: intact.   OBJECTIVE:  BP 126/78   Pulse 95   Wt 158 lb (71.7 kg)   LMP 10/23/2018   BMI 27.99 kg/m   Appears well, no apparent distress  Results for orders placed or performed in visit on 07/08/19 (from the past 24 hour(s))  POC Urinalysis Dipstick OB   Collection Time: 07/08/19 12:27 PM  Result Value Ref Range   Color, UA     Clarity, UA     Glucose, UA Negative Negative   Bilirubin, UA     Ketones, UA neg    Spec Grav, UA     Blood, UA neg    pH, UA     POC,PROTEIN,UA Negative Negative, Trace, Small (1+), Moderate (2+), Large (3+), 4+   Urobilinogen, UA     Nitrite, UA neg    Leukocytes, UA Negative Negative   Appearance     Odor      NST: FHR baseline 145 bpm, Variability: moderate, Accelerations:present, Decelerations:  Absent= Cat 1/Reactive Toco: none   ASSESSMENT: G2P1001 at [redacted]w[redacted]d with A2DM currently on Metformin 1000 mg bid NST reactive  PLAN: EFM strip reviewed by Dr. Despina Hidden   Recommendations: keep next appointment as scheduled    Sara [name]  07/08/2019 12:37 PM

## 2019-07-11 ENCOUNTER — Other Ambulatory Visit: Payer: Self-pay | Admitting: Obstetrics & Gynecology

## 2019-07-11 DIAGNOSIS — O24419 Gestational diabetes mellitus in pregnancy, unspecified control: Secondary | ICD-10-CM

## 2019-07-12 ENCOUNTER — Ambulatory Visit (INDEPENDENT_AMBULATORY_CARE_PROVIDER_SITE_OTHER): Payer: Medicaid Other | Admitting: Obstetrics & Gynecology

## 2019-07-12 ENCOUNTER — Encounter: Payer: Self-pay | Admitting: Obstetrics & Gynecology

## 2019-07-12 ENCOUNTER — Other Ambulatory Visit: Payer: Self-pay

## 2019-07-12 ENCOUNTER — Ambulatory Visit (INDEPENDENT_AMBULATORY_CARE_PROVIDER_SITE_OTHER): Payer: Medicaid Other

## 2019-07-12 VITALS — BP 125/79 | HR 101 | Wt 161.0 lb

## 2019-07-12 DIAGNOSIS — O24415 Gestational diabetes mellitus in pregnancy, controlled by oral hypoglycemic drugs: Secondary | ICD-10-CM

## 2019-07-12 DIAGNOSIS — Z3A37 37 weeks gestation of pregnancy: Secondary | ICD-10-CM | POA: Diagnosis not present

## 2019-07-12 DIAGNOSIS — O24419 Gestational diabetes mellitus in pregnancy, unspecified control: Secondary | ICD-10-CM

## 2019-07-12 DIAGNOSIS — Z1389 Encounter for screening for other disorder: Secondary | ICD-10-CM

## 2019-07-12 DIAGNOSIS — O0990 Supervision of high risk pregnancy, unspecified, unspecified trimester: Secondary | ICD-10-CM

## 2019-07-12 DIAGNOSIS — Z331 Pregnant state, incidental: Secondary | ICD-10-CM

## 2019-07-12 DIAGNOSIS — O0993 Supervision of high risk pregnancy, unspecified, third trimester: Secondary | ICD-10-CM

## 2019-07-12 LAB — POCT URINALYSIS DIPSTICK OB
Glucose, UA: NEGATIVE
Ketones, UA: NEGATIVE
Nitrite, UA: NEGATIVE
POC,PROTEIN,UA: NEGATIVE

## 2019-07-12 NOTE — Progress Notes (Signed)
[place] 37+3 wks,cephalic,BPP 8/8,FHR 140 bpm,posterior placenta gr 3,afi 10.7 cm,EFW 2915 g 31%

## 2019-07-12 NOTE — Progress Notes (Signed)
HIGH-RISK PREGNANCY VISIT Patient name: Sara Sara [name] MRN [id number]  Date of birth: 21-Jul-1995 Chief Complaint:   High Risk Gestation ([place] today)  History of Present Illness:   Sara Sara [name] is a 24 y.o. G41P1001 female at 105w3d with an Estimated Date of Delivery: 07/30/19 being seen today for ongoing management of a high-risk pregnancy complicated by Class A2 DM on metformin 1000 mg BID .  Today she reports no complaints.  Depression screen [facility] 2/9 01/20/2019 02/16/2017 12/22/2016  Decreased Interest 0 1 2  Down, Depressed, Hopeless 0 3 2  PHQ - 2 Score 0 4 4  Altered sleeping 1 0 1  Tired, decreased energy 2 2 1   Change in appetite 0 1 0  Feeling bad or failure about yourself  0 1 1  Trouble concentrating 0 0 0  Moving slowly or fidgety/restless 0 0 0  Suicidal thoughts 0 0 0  PHQ-9 Score 3 8 7   Difficult doing work/chores - Somewhat difficult -    Contractions: Not present. Vag. Bleeding: None.  Movement: Present. denies leaking of fluid.  Review of Systems:   Pertinent items are noted in HPI Denies abnormal vaginal discharge w/ itching/odor/irritation, headaches, visual changes, shortness of breath, chest pain, abdominal pain, severe nausea/vomiting, or problems with urination or bowel movements unless otherwise stated above. Pertinent History Reviewed:  Reviewed past medical,surgical, social, obstetrical and family history.  Reviewed problem list, medications and allergies. Physical Assessment:   Vitals:   07/12/19 1602  BP: 125/79  Pulse: (!) 101  Weight: 161 lb (73 kg)  Body mass index is 28.52 kg/m.           Physical Examination:   General appearance: alert, well appearing, and in no distress  Mental status: alert, oriented to person, place, and time  Skin: warm & dry   Extremities: Edema: None    Cardiovascular: normal heart rate noted  Respiratory: normal respiratory effort, no distress  Abdomen: gravid, soft, non-tender  Pelvic: Cervical exam deferred           Fetal Status:     Movement: Present    Fetal Surveillance Testing today: BPP 8/8   Chaperone: n/a    Results for orders placed or performed in visit on 07/12/19 (from the past 24 hour(s))  POC Urinalysis Dipstick OB   Collection Time: 07/12/19  4:03 PM  Result Value Ref Range   Color, UA     Clarity, UA     Glucose, UA Negative Negative   Bilirubin, UA     Ketones, UA neg    Spec Grav, UA     Blood, UA trace    pH, UA     POC,PROTEIN,UA Negative Negative, Trace, Small (1+), Moderate (2+), Large (3+), 4+   Urobilinogen, UA     Nitrite, UA neg    Leukocytes, UA Trace (A) Negative   Appearance     Odor      Assessment & Plan:  1) High-risk pregnancy G2P1001 at [redacted]w[redacted]d with an Estimated Date of Delivery: 07/30/19   2) Class A2 DM, stable, EFW 31%    Meds: No orders of the defined types were placed in this encounter.   Labs/procedures today:   Treatment Plan:  IOL 39 weeks  Reviewed:  labor symptoms and general obstetric precautions including but not limited to vaginal bleeding, contractions, leaking of fluid and fetal movement were reviewed in detail with the patient.  All questions were answered.  home bp Sara [name]. Rx faxed to .  Check bp weekly, let us know if >140/90.   Follow-up: Return in about 1 week (around 07/19/2019) for BPP/sono, HROB.  Orders Placed This Encounter  Procedures  . POC Urinalysis Dipstick OB   Florian Buff  07/12/2019 4:31 PM

## 2019-07-15 ENCOUNTER — Other Ambulatory Visit: Payer: Self-pay | Admitting: Obstetrics & Gynecology

## 2019-07-15 ENCOUNTER — Ambulatory Visit (INDEPENDENT_AMBULATORY_CARE_PROVIDER_SITE_OTHER): Payer: Medicaid Other | Admitting: *Deleted

## 2019-07-15 ENCOUNTER — Other Ambulatory Visit: Payer: Self-pay

## 2019-07-15 VITALS — BP 113/77 | HR 77 | Wt 161.5 lb

## 2019-07-15 DIAGNOSIS — Z331 Pregnant state, incidental: Secondary | ICD-10-CM

## 2019-07-15 DIAGNOSIS — Z1389 Encounter for screening for other disorder: Secondary | ICD-10-CM | POA: Diagnosis not present

## 2019-07-15 DIAGNOSIS — O24419 Gestational diabetes mellitus in pregnancy, unspecified control: Secondary | ICD-10-CM | POA: Diagnosis not present

## 2019-07-15 DIAGNOSIS — O0990 Supervision of high risk pregnancy, unspecified, unspecified trimester: Secondary | ICD-10-CM

## 2019-07-15 LAB — POCT URINALYSIS DIPSTICK OB
Blood, UA: NEGATIVE
Glucose, UA: NEGATIVE
Ketones, UA: NEGATIVE
Nitrite, UA: NEGATIVE
POC,PROTEIN,UA: NEGATIVE

## 2019-07-15 NOTE — Progress Notes (Signed)
   NURSE VISIT- NST  SUBJECTIVE:  Sara [name] is a 24 y.o. G2P1001 female at [redacted]w[redacted]d, here for a NST for pregnancy complicated by A2DM currently on Metformin BID.  She reports active fetal movement, contractions: none, vaginal bleeding: none, membranes: intact.   OBJECTIVE:  BP 113/77   Pulse 77   Wt 161 lb 8 oz (73.3 kg)   LMP 10/23/2018   BMI 28.61 kg/m   Appears well, no apparent distress  Results for orders placed or performed in visit on 07/15/19 (from the past 24 hour(s))  POC Urinalysis Dipstick OB   Collection Time: 07/15/19 12:39 PM  Result Value Ref Range   Color, UA     Clarity, UA     Glucose, UA Negative Negative   Bilirubin, UA     Ketones, UA neg    Spec Grav, UA     Blood, UA neg    pH, UA     POC,PROTEIN,UA Negative Negative, Trace, Small (1+), Moderate (2+), Large (3+), 4+   Urobilinogen, UA     Nitrite, UA neg    Leukocytes, UA Trace (A) Negative   Appearance     Odor      NST: FHR baseline 135 bpm, Variability: moderate, Accelerations:present, Decelerations:  Absent= Cat 135/Reactive Toco: occasional   ASSESSMENT: G2P1001 at [redacted]w[redacted]d with A2DM currently on Metformin BID NST reactive  PLAN: EFM strip reviewed by Dr. Despina Hidden   Recommendations: keep next appointment as scheduled    Jobe Marker  07/15/2019 1:38 PM

## 2019-07-18 ENCOUNTER — Ambulatory Visit (INDEPENDENT_AMBULATORY_CARE_PROVIDER_SITE_OTHER): Payer: Medicaid Other | Admitting: Advanced Practice Midwife

## 2019-07-18 ENCOUNTER — Encounter: Payer: Self-pay | Admitting: Advanced Practice Midwife

## 2019-07-18 ENCOUNTER — Encounter (HOSPITAL_COMMUNITY): Payer: Self-pay | Admitting: Family Medicine

## 2019-07-18 ENCOUNTER — Telehealth (HOSPITAL_COMMUNITY): Payer: Self-pay | Admitting: *Deleted

## 2019-07-18 ENCOUNTER — Inpatient Hospital Stay (HOSPITAL_COMMUNITY): Payer: Medicaid Other | Admitting: Anesthesiology

## 2019-07-18 ENCOUNTER — Other Ambulatory Visit: Payer: Self-pay

## 2019-07-18 ENCOUNTER — Inpatient Hospital Stay (HOSPITAL_COMMUNITY)
Admission: AD | Admit: 2019-07-18 | Discharge: 2019-07-20 | DRG: 807 | Disposition: A | Discharge status: Home / Self Care | Payer: Medicaid Other | Attending: Obstetrics and Gynecology | Admitting: Obstetrics and Gynecology

## 2019-07-18 ENCOUNTER — Other Ambulatory Visit: Payer: Self-pay | Admitting: Obstetrics & Gynecology

## 2019-07-18 ENCOUNTER — Ambulatory Visit (INDEPENDENT_AMBULATORY_CARE_PROVIDER_SITE_OTHER): Payer: Medicaid Other

## 2019-07-18 VITALS — BP 123/81 | HR 98 | Wt 161.0 lb

## 2019-07-18 DIAGNOSIS — Z8759 Personal history of other complications of pregnancy, childbirth and the puerperium: Secondary | ICD-10-CM

## 2019-07-18 DIAGNOSIS — F418 Other specified anxiety disorders: Secondary | ICD-10-CM | POA: Diagnosis present

## 2019-07-18 DIAGNOSIS — O0990 Supervision of high risk pregnancy, unspecified, unspecified trimester: Secondary | ICD-10-CM

## 2019-07-18 DIAGNOSIS — Z8616 Personal history of COVID-19: Secondary | ICD-10-CM

## 2019-07-18 DIAGNOSIS — O99343 Other mental disorders complicating pregnancy, third trimester: Secondary | ICD-10-CM | POA: Diagnosis not present

## 2019-07-18 DIAGNOSIS — O26893 Other specified pregnancy related conditions, third trimester: Secondary | ICD-10-CM | POA: Diagnosis present

## 2019-07-18 DIAGNOSIS — O24425 Gestational diabetes mellitus in childbirth, controlled by oral hypoglycemic drugs: Principal | ICD-10-CM | POA: Diagnosis present

## 2019-07-18 DIAGNOSIS — O24419 Gestational diabetes mellitus in pregnancy, unspecified control: Secondary | ICD-10-CM

## 2019-07-18 DIAGNOSIS — O24414 Gestational diabetes mellitus in pregnancy, insulin controlled: Secondary | ICD-10-CM

## 2019-07-18 DIAGNOSIS — Z3A38 38 weeks gestation of pregnancy: Secondary | ICD-10-CM

## 2019-07-18 DIAGNOSIS — Z8632 Personal history of gestational diabetes: Secondary | ICD-10-CM

## 2019-07-18 DIAGNOSIS — O2393 Unspecified genitourinary tract infection in pregnancy, third trimester: Secondary | ICD-10-CM

## 2019-07-18 DIAGNOSIS — N393 Stress incontinence (female) (male): Secondary | ICD-10-CM

## 2019-07-18 DIAGNOSIS — Z20822 Contact with and (suspected) exposure to covid-19: Secondary | ICD-10-CM | POA: Diagnosis present

## 2019-07-18 DIAGNOSIS — Z331 Pregnant state, incidental: Secondary | ICD-10-CM

## 2019-07-18 DIAGNOSIS — Z87891 Personal history of nicotine dependence: Secondary | ICD-10-CM | POA: Diagnosis not present

## 2019-07-18 DIAGNOSIS — Z1389 Encounter for screening for other disorder: Secondary | ICD-10-CM

## 2019-07-18 DIAGNOSIS — O9952 Diseases of the respiratory system complicating childbirth: Secondary | ICD-10-CM | POA: Diagnosis not present

## 2019-07-18 DIAGNOSIS — O24429 Gestational diabetes mellitus in childbirth, unspecified control: Secondary | ICD-10-CM | POA: Diagnosis not present

## 2019-07-18 DIAGNOSIS — Z30430 Encounter for insertion of intrauterine contraceptive device: Secondary | ICD-10-CM

## 2019-07-18 DIAGNOSIS — J45909 Unspecified asthma, uncomplicated: Secondary | ICD-10-CM | POA: Diagnosis not present

## 2019-07-18 LAB — POCT URINALYSIS DIPSTICK OB
Blood, UA: NEGATIVE
Glucose, UA: NEGATIVE
Ketones, UA: NEGATIVE
Leukocytes, UA: NEGATIVE
Nitrite, UA: NEGATIVE
POC,PROTEIN,UA: NEGATIVE

## 2019-07-18 LAB — ABO/RH: ABO/RH(D): A POS

## 2019-07-18 LAB — CBC
HCT: 42.6 % (ref 36.0–46.0)
Hemoglobin: 14.7 g/dL (ref 12.0–15.0)
MCH: 31.9 pg (ref 26.0–34.0)
MCHC: 34.5 g/dL (ref 30.0–36.0)
MCV: 92.4 fL (ref 80.0–100.0)
Platelets: 202 10*3/uL (ref 150–400)
RBC: 4.61 MIL/uL (ref 3.87–5.11)
RDW: 12.7 % (ref 11.5–15.5)
WBC: 13 10*3/uL — ABNORMAL HIGH (ref 4.0–10.5)
nRBC: 0 % (ref 0.0–0.2)

## 2019-07-18 LAB — TYPE AND SCREEN
ABO/RH(D): A POS
Antibody Screen: NEGATIVE

## 2019-07-18 LAB — GLUCOSE, CAPILLARY: Glucose-Capillary: 81 mg/dL (ref 70–99)

## 2019-07-18 LAB — RESPIRATORY PANEL BY RT PCR (FLU A&B, COVID)
Influenza A by PCR: NEGATIVE
Influenza B by PCR: NEGATIVE
SARS Coronavirus 2 by RT PCR: NEGATIVE

## 2019-07-18 MED ORDER — LACTATED RINGERS IV SOLN: 500.0000 mL | INTRAVENOUS | Status: DC | PRN

## 2019-07-18 MED ORDER — PHENYLEPHRINE 40 MCG/ML (10ML) SYRINGE FOR IV PUSH (FOR BLOOD PRESSURE SUPPORT): 80.0000 ug | PREFILLED_SYRINGE | INTRAVENOUS | Status: DC | PRN

## 2019-07-18 MED ORDER — EPHEDRINE 5 MG/ML INJ: 10.0000 mg | INTRAVENOUS | Status: DC | PRN

## 2019-07-18 MED ORDER — SOD CITRATE-CITRIC ACID 500-334 MG/5ML PO SOLN: 30.0000 mL | ORAL | Status: DC | PRN

## 2019-07-18 MED ORDER — LACTATED RINGERS IV SOLN: 500.0000 mL | Freq: Once | INTRAVENOUS | Status: DC

## 2019-07-18 MED ORDER — TETANUS-DIPHTH-ACELL PERTUSSIS 5-2.5-18.5 LF-MCG/0.5 IM SUSP: 0.5000 mL | Freq: Once | INTRAMUSCULAR | Status: DC

## 2019-07-18 MED ORDER — ONDANSETRON HCL 4 MG/2ML IJ SOLN: 4.0000 mg | Freq: Four times a day (QID) | INTRAMUSCULAR | Status: DC | PRN

## 2019-07-18 MED ORDER — FENTANYL-BUPIVACAINE-NACL 0.5-0.125-0.9 MG/250ML-% EP SOLN: 12.0000 mL/h | EPIDURAL | Status: DC | PRN

## 2019-07-18 MED ORDER — TERBUTALINE SULFATE 1 MG/ML IJ SOLN: 0.2500 mg | Freq: Once | INTRAMUSCULAR | Status: DC | PRN

## 2019-07-18 MED ORDER — HYDROXYZINE HCL 50 MG PO TABS: 50.0000 mg | ORAL_TABLET | Freq: Four times a day (QID) | ORAL | Status: DC | PRN

## 2019-07-18 MED ORDER — OXYCODONE-ACETAMINOPHEN 5-325 MG PO TABS: 1.0000 | ORAL_TABLET | ORAL | Status: DC | PRN

## 2019-07-18 MED ORDER — ZOLPIDEM TARTRATE 5 MG PO TABS: 5.0000 mg | ORAL_TABLET | Freq: Every evening | ORAL | Status: DC | PRN

## 2019-07-18 MED ORDER — SODIUM CHLORIDE (PF) 0.9 % IJ SOLN
INTRAMUSCULAR | Status: DC | PRN
  Administered 2019-07-18: 12 mL/h via EPIDURAL

## 2019-07-18 MED ORDER — SENNOSIDES-DOCUSATE SODIUM 8.6-50 MG PO TABS
2.0000 | ORAL_TABLET | ORAL | Status: DC
  Administered 2019-07-18 – 2019-07-20 (×2): 2 via ORAL
  Filled 2019-07-18 (×2): qty 2

## 2019-07-18 MED ORDER — SIMETHICONE 80 MG PO CHEW: 80.0000 mg | CHEWABLE_TABLET | ORAL | Status: DC | PRN

## 2019-07-18 MED ORDER — OXYTOCIN BOLUS FROM INFUSION
500.0000 mL | Freq: Once | INTRAVENOUS | Status: AC
  Administered 2019-07-18: 500 mL via INTRAVENOUS

## 2019-07-18 MED ORDER — MISOPROSTOL 25 MCG QUARTER TABLET: 25.0000 ug | ORAL_TABLET | ORAL | Status: DC | PRN

## 2019-07-18 MED ORDER — WITCH HAZEL-GLYCERIN EX PADS: 1.0000 "application " | MEDICATED_PAD | CUTANEOUS | Status: DC | PRN

## 2019-07-18 MED ORDER — FENTANYL CITRATE (PF) 100 MCG/2ML IJ SOLN
50.0000 ug | INTRAMUSCULAR | Status: DC | PRN
  Administered 2019-07-18: 13:00:00 50 ug via INTRAVENOUS
  Filled 2019-07-18: qty 2

## 2019-07-18 MED ORDER — BENZOCAINE-MENTHOL 20-0.5 % EX AERO: 1.0000 "application " | INHALATION_SPRAY | CUTANEOUS | Status: DC | PRN

## 2019-07-18 MED ORDER — ONDANSETRON HCL 4 MG PO TABS: 4.0000 mg | ORAL_TABLET | ORAL | Status: DC | PRN

## 2019-07-18 MED ORDER — FLEET ENEMA 7-19 GM/118ML RE ENEM: 1.0000 | ENEMA | RECTAL | Status: DC | PRN

## 2019-07-18 MED ORDER — LEVONORGESTREL 19.5 MCG/DAY IU IUD
INTRAUTERINE_SYSTEM | Freq: Once | INTRAUTERINE | Status: AC
  Administered 2019-07-18: 1 via INTRAUTERINE
  Filled 2019-07-18: qty 1

## 2019-07-18 MED ORDER — DIPHENHYDRAMINE HCL 50 MG/ML IJ SOLN: 12.5000 mg | INTRAMUSCULAR | Status: DC | PRN

## 2019-07-18 MED ORDER — DIBUCAINE (PERIANAL) 1 % EX OINT: 1.0000 "application " | TOPICAL_OINTMENT | CUTANEOUS | Status: DC | PRN

## 2019-07-18 MED ORDER — PRENATAL MULTIVITAMIN CH
1.0000 | ORAL_TABLET | Freq: Every day | ORAL | Status: DC
  Administered 2019-07-19 – 2019-07-20 (×2): 1 via ORAL
  Filled 2019-07-18 (×2): qty 1

## 2019-07-18 MED ORDER — DIPHENHYDRAMINE HCL 25 MG PO CAPS: 25.0000 mg | ORAL_CAPSULE | Freq: Four times a day (QID) | ORAL | Status: DC | PRN

## 2019-07-18 MED ORDER — ACETAMINOPHEN 325 MG PO TABS: 650.0000 mg | ORAL_TABLET | ORAL | Status: DC | PRN

## 2019-07-18 MED ORDER — FENTANYL-BUPIVACAINE-NACL 0.5-0.125-0.9 MG/250ML-% EP SOLN
EPIDURAL | Status: AC
  Filled 2019-07-18: qty 250

## 2019-07-18 MED ORDER — ONDANSETRON HCL 4 MG/2ML IJ SOLN: 4.0000 mg | INTRAMUSCULAR | Status: DC | PRN

## 2019-07-18 MED ORDER — ACETAMINOPHEN 325 MG PO TABS
650.0000 mg | ORAL_TABLET | Freq: Four times a day (QID) | ORAL | Status: DC | PRN
  Administered 2019-07-19 – 2019-07-20 (×6): 650 mg via ORAL
  Filled 2019-07-18 (×6): qty 2

## 2019-07-18 MED ORDER — TRANEXAMIC ACID-NACL 1000-0.7 MG/100ML-% IV SOLN
1000.0000 mg | INTRAVENOUS | Status: AC
  Administered 2019-07-18: 16:00:00 1000 mg via INTRAVENOUS
  Filled 2019-07-18: qty 100

## 2019-07-18 MED ORDER — IBUPROFEN 600 MG PO TABS
600.0000 mg | ORAL_TABLET | Freq: Three times a day (TID) | ORAL | Status: DC | PRN
  Filled 2019-07-18 (×2): qty 1

## 2019-07-18 MED ORDER — OXYCODONE-ACETAMINOPHEN 5-325 MG PO TABS: 2.0000 | ORAL_TABLET | ORAL | Status: DC | PRN

## 2019-07-18 MED ORDER — LIDOCAINE HCL (PF) 1 % IJ SOLN: 30.0000 mL | INTRAMUSCULAR | Status: DC | PRN

## 2019-07-18 MED ORDER — OXYTOCIN 40 UNITS IN NORMAL SALINE INFUSION - SIMPLE MED
2.5000 [IU]/h | INTRAVENOUS | Status: DC
  Filled 2019-07-18: qty 1000

## 2019-07-18 MED ORDER — COCONUT OIL OIL: 1.0000 "application " | TOPICAL_OIL | Status: DC | PRN

## 2019-07-18 MED ORDER — MEASLES, MUMPS & RUBELLA VAC IJ SOLR: 0.5000 mL | Freq: Once | INTRAMUSCULAR | Status: DC

## 2019-07-18 MED ORDER — LIDOCAINE HCL (PF) 1 % IJ SOLN
INTRAMUSCULAR | Status: DC | PRN
  Administered 2019-07-18 (×2): 4 mL via EPIDURAL

## 2019-07-18 MED ORDER — LACTATED RINGERS IV SOLN: INTRAVENOUS | Status: DC

## 2019-07-18 NOTE — Anesthesia Postprocedure Evaluation (Signed)
Anesthesia Post Note  Patient: Sara [name]  Procedure(s) Performed: AN AD HOC LABOR EPIDURAL     Patient location during evaluation: Mother Baby Anesthesia Type: Epidural Level of consciousness: awake and alert and oriented Pain management: satisfactory to patient Vital Signs Assessment: post-procedure vital signs reviewed and stable Respiratory status: respiratory function stable Cardiovascular status: stable Postop Assessment: no headache, no backache, epidural receding, patient able to bend at knees, no signs of nausea or vomiting and adequate PO intake Anesthetic complications: no    Last Vitals:  Vitals:   07/18/19 1733 07/18/19 1812  BP: 117/82 124/75  Pulse: 80 88  Resp: 18 18  Temp:  36.8 C  SpO2:      Last Pain:  Vitals:   07/18/19 1812  TempSrc: Oral  PainSc: 0-No pain   Pain Goal:                   Cobi Aldape

## 2019-07-18 NOTE — H&P (Addendum)
OBSTETRIC ADMISSION HISTORY AND PHYSICAL  Sara [name] is a 24 y.o. female G2P1001 with IUP at 68w2dby LMP presenting for SOL. She reports +FMs, No LOF, no VB, no blurry vision, headaches or peripheral edema, and RUQ pain.  She plans on breast feeding. She request postpartum IUD for birth control. [facility]  Dating: By LMP --->  Estimated Date of Delivery: 07/30/19  Sono:    '@[redacted]w[redacted]d' , CWD, normal anatomy, cephalic presentation, 29532Y 31% EFW   Prenatal History/Complications:  GDMA2 (metformin 10010mBID) Hx PPH Maternal depression; not on medications  Past Medical History: Past Medical History:  Diagnosis Date  . Asthma   . Kidney stone    UTI    Past Surgical History: Past Surgical History:  Procedure Laterality Date  . CYSTOSCOPY WITH URETEROSCOPY, STONE BASKETRY AND STENT PLACEMENT Right 10/06/2014   Procedure: CYSTOSCOPY WITH URETEROSCOPY, STONE BASKETRY AND STENT PLACEMENT, RETROGRADE;  Surgeon: PaCleon GustinMD;  Location: [facility];  Service: Urology;  Laterality: Right;  . TONSILLECTOMY      Obstetrical History: OB History    Gravida  2   Para  1   Term  1   Preterm      AB      Living  1     SAB      TAB      Ectopic      Multiple  0   Live Births  1           Social History Social History   Socioeconomic History  . Marital status: Single    Spouse name: Not on file  . Number of children: 0  . Years of education: Not on file  . Highest education level: High school graduate  Occupational History  . Not on file  Tobacco Use  . Smoking status: Former Smoker    Types: Cigarettes    Quit date: 06/13/2015    Years since quitting: 4.0  . Smokeless tobacco: Never Used  Substance and Sexual Activity  . Alcohol use: No    Alcohol/week: 0.0 standard drinks  . Drug use: No  . Sexual activity: Yes    Birth control/protection: None  Other Topics Concern  . Not on file  Social History Narrative    Lives with father, recovered alcoholic, mother abandoned family- reportedly abuses drugs and alcohol   Social Determinants of Health   Financial Resource Strain: Low Risk   . Difficulty of Paying Living Expenses: Not hard at all  Food Insecurity: No Food Insecurity  . Worried About RuCharity fundraisern the Last Year: Never true  . Ran Out of Food in the Last Year: Never true  Transportation Needs: No Transportation Needs  . Lack of Transportation (Medical): No  . Lack of Transportation (Non-Medical): No  Physical Activity: Insufficiently Active  . Days of Exercise per Week: 2 days  . Minutes of Exercise per Session: 30 min  Stress: No Stress Concern Present  . Feeling of Stress : Not at all  Social Connections: Somewhat Isolated  . Frequency of Communication with Friends and Family: More than three times a week  . Frequency of Social Gatherings with Friends and Family: Once a week  . Attends Religious Services: Never  . Active Member of Clubs or Organizations: No  . Attends ClArchivisteetings: Never  . Marital Status: Living with partner    Family History: Family History  Problem Relation Age  of Onset  . Bipolar disorder Mother   . Heart murmur Mother   . Alcohol abuse Mother   . Cancer Mother        breast  . Hypertension Father   . Hypertension Paternal Grandfather   . Arthritis Neg Hx     Allergies: No Known Allergies  Medications Prior to Admission  Medication Sig Dispense Refill Last Dose  . metFORMIN (GLUCOPHAGE) 1000 MG tablet Take 1 tablet (1,000 mg total) by mouth 2 (two) times daily with a meal. 60 tablet 1 07/17/2019 at Unknown time  . Prenatal Vit-Fe Fumarate-FA (PREPLUS) 27-1 MG TABS Take 1 tablet by mouth daily. 30 tablet 13 07/17/2019 at Unknown time  . Accu-Chek Softclix Lancets lancets Use as instructed to check blood sugar 4 times daily 100 each 12   . Blood Glucose Monitoring Suppl (ACCU-CHEK GUIDE ME) w/Device KIT 1 each by Does not apply  route 4 (four) times daily. 1 kit 0   . Blood Pressure Monitor MISC For regular home bp monitoring during pregnancy 1 each 0   . glucose blood (ACCU-CHEK GUIDE) test strip Use as instructed to check blood sugar 4 times daily 50 each 12      Review of Systems   All systems reviewed and negative except as stated in HPI  Blood pressure 121/77, pulse 87, temperature 98.9 F (37.2 C), temperature source Oral, resp. rate 16, height '5\' 3"'  (1.6 m), weight 73.6 kg, last menstrual period 10/23/2018, SpO2 98 %. General appearance: alert, cooperative, appears stated age and moderate distress Lungs: clear to auscultation bilaterally Heart: regular rate and rhythm Abdomen: soft, non-tender; bowel sounds normal Pelvic: deferred to MAU Extremities: no sign of DVT Presentation: cephalic Fetal monitoringBaseline: 145 bpm, Variability: Good {> 6 bpm), Accelerations: Non-reactive but appropriate for gestational age and Decelerations: Absent Uterine activityFrequency: Every 3 minutes, Duration: 60 seconds Dilation: 6 Effacement (%): 90 Station: -2 Exam by:: Ginger Morris RN   Prenatal labs: ABO, Rh: --/--/A POS (05/03 1250) Antibody: NEG (05/03 1250) Rubella: 6.31 (11/05 1206) RPR: Non Reactive (03/03 0843)  HBsAg: Negative (11/05 1206)  HIV: Non Reactive (03/03 0843)  GBS: Negative/-- (04/20 0000)  2 hr Glucola, elevated at 200 on 05/18/19 Genetic screening WNL Anatomy US WNL  Prenatal Transfer Tool  Maternal Diabetes: Yes:  Diabetes Type:  Insulin/Medication controlled Genetic Screening: Normal Maternal Ultrasounds/Referrals: Normal Fetal Ultrasounds or other Referrals:  None Maternal Substance Abuse:  No Significant Maternal Medications:  Meds include: Other: Metformin Significant Maternal Lab Results: Group B Strep negative  Results for orders placed or performed during the hospital encounter of 07/18/19 (from the past 24 hour(s))  CBC   Collection Time: 07/18/19 12:50 PM  Result  Value Ref Range   WBC 13.0 (H) 4.0 - 10.5 K/uL   RBC 4.61 3.87 - 5.11 MIL/uL   Hemoglobin 14.7 12.0 - 15.0 g/dL   HCT 42.6 36.0 - 46.0 %   MCV 92.4 80.0 - 100.0 fL   MCH 31.9 26.0 - 34.0 pg   MCHC 34.5 30.0 - 36.0 g/dL   RDW 12.7 11.5 - 15.5 %   Platelets 202 150 - 400 K/uL   nRBC 0.0 0.0 - 0.2 %  Type and screen   Collection Time: 07/18/19 12:50 PM  Result Value Ref Range   ABO/RH(D) A POS    Antibody Screen NEG    Sample Expiration      07/21/2019,2359 Performed at [facility], [place] [address], [place], [place] [address]  Results for orders placed or performed in visit on 07/18/19 (from the past 24 hour(s))  POC Urinalysis Dipstick OB   Collection Time: 07/18/19  9:28 AM  Result Value Ref Range   Color, UA     Clarity, UA     Glucose, UA Negative Negative   Bilirubin, UA     Ketones, UA neg    Spec Grav, UA     Blood, UA neg    pH, UA     POC,PROTEIN,UA Negative Negative, Trace, Small (1+), Moderate (2+), Large (3+), 4+   Urobilinogen, UA     Nitrite, UA neg    Leukocytes, UA Negative Negative   Appearance     Odor      Patient Active Problem List   Diagnosis Date Noted  . White classification A2 gestational diabetes mellitus 07/18/2019  . Gestational diabetes mellitus, class A2 05/19/2019  . COVID-19 04/17/2019  . History of postpartum hemorrhage 04/14/2019  . Supervision of high risk pregnancy, antepartum 01/20/2019  . Depression with anxiety 02/16/2017  . SUI (stress urinary incontinence, female) 05/08/2016    Assessment/Plan:  Sara [name] is a 24 y.o. G2P1001 at 96w2dhere for SOL  #Labor: Anticipate vaginal delivery. Multip at 6/90/-2, will continue with expectant management. Can start pitocin if no progression after epidural.  #Pain: As patient requests #FWB: Cat 1, EFW 31% #ID: GBS negative #MOF: Breast #MOC: IUD PP #Circ: Yes, inpatient Hx of PPH: TXA prior to delivery   RAlroy Bailiff DO  07/18/2019, 1:52 PM  I saw and evaluated  the patient. I agree with the findings and the plan of care as documented in the resident's note. Vertex by RN exam. EFW 3600g. Expectant management, AROM PRN. Anticipate SVD. GDMA2: q2h CBG's. Initial glucose 81.   CBarrington Ellison MD OHolly Hill HospitalFamily Medicine Fellow, [facility]mingtonfor WDe[facility]ondon

## 2019-07-18 NOTE — Progress Notes (Signed)
Induction Assessment Scheduling Form: Fax to [facility]:  [phone] Route to [facility] Labor Delivery   Sara [name]                                                                                   DOB:  21-Nov-1995                                                            MRN:  [id number]  Phone:  Home Phone [phone]  Mobile [phone]    Provider:  [facility] (Faculty Practice)  Admission Date/Time:  07/23/19 am (not midnight) GP:  G2P1001     Gestational age on admission:  39                                                Estimated Date of Delivery: 07/30/19  Dating Criteria: [place] at 8 weeks  Filed Weights   07/18/19 0916  Weight: 161 lb (73 kg)    GBS: Negative/-- (04/20 0000) HIV:  Non Reactive (03/03 0843)  Medical Indications for induction:  pitocin Scheduling Provider Signature:  Jacklyn Shell, CNM      Dilation: 5.5 Effacement (%): 80 Station: -2 Presentation: Vertex  Method of induction(proposed):  Pitocin   Scheduling Provider Signature:  Jacklyn Shell, CNM                                            Today's Date:  07/18/2019

## 2019-07-18 NOTE — Procedures (Signed)
Patient desired post-placental IUD insertion.  IUD Insertion Procedure Note Patient identified, informed consent performed, consent signed.   Discussed risks of irregular bleeding, cramping, infection, malpositioning or misplacement of the IUD outside the uterus which may require further procedure such as laparoscopy. Discussed higher risk of expulsion in post-partum period. Also discussed >99% contraception efficacy, increased risk of ectopic pregnancy with failure of method.  Time out was performed.   Liletta IUD removed from insertor and placed manually to fundus. Strings trimmed to 3 cm from cervix. Patient tolerated procedure well.   Patient was given post-procedure instructions. She will follow-up at post-partum visit.   Lot: [id number] Exp: 07/2022  Jerilynn Birkenhead, MD OB Family Medicine Fellow, [facility] for [employer], [facility]

## 2019-07-18 NOTE — MAU Note (Signed)
Sara [name] is a 24 y.o. at [redacted]w[redacted]d here in MAU reporting: contractions started yesterday but today they are stronger. Every 4-5 minutes. No bleeding. No LOF. Was 5 cm in the office. +FM  Onset of complaint: yesterday but worse today  Pain score: 8/10  Vitals:   07/18/19 1230  BP: 110/71  Pulse: 95  Resp: 18  Temp: 98.7 F (37.1 C)  SpO2: 100%     FHT: +FM  Lab orders placed from triage: none

## 2019-07-18 NOTE — Telephone Encounter (Signed)
Preadmission screen  

## 2019-07-18 NOTE — Anesthesia Procedure Notes (Signed)
Epidural Patient location during procedure: OB Start time: 07/18/2019 1:35 PM End time: 07/18/2019 1:38 PM  Staffing Anesthesiologist: Kaylyn Layer, MD Performed: anesthesiologist   Preanesthetic Checklist Completed: patient identified, IV checked, risks and benefits discussed, monitors and equipment checked, pre-op evaluation and timeout performed  Epidural Patient position: sitting Prep: DuraPrep and site prepped and draped Patient monitoring: continuous pulse ox, blood pressure and heart rate Approach: midline Location: L3-L4 Injection technique: LOR air  Needle:  Needle type: Tuohy  Needle gauge: 17 G Needle length: 9 cm Needle insertion depth: 6 cm Catheter type: closed end flexible Catheter size: 19 Gauge Catheter at skin depth: 11 cm Test dose: negative and Other (1% lidocaine)  Assessment Events: blood not aspirated, injection not painful, no injection resistance, no paresthesia and negative IV test  Additional Notes Patient identified. Risks, benefits, and alternatives discussed with patient including but not limited to bleeding, infection, nerve damage, paralysis, failed block, incomplete pain control, headache, blood pressure changes, nausea, vomiting, reactions to medication, itching, and postpartum back pain. Confirmed with bedside nurse the patient's most recent platelet count. Confirmed with patient that they are not currently taking any anticoagulation, have any bleeding history, or any family history of bleeding disorders. Patient expressed understanding and wished to proceed. All questions were answered. Sterile technique was used throughout the entire procedure. Please see nursing notes for vital signs.   Crisp LOR on first pass. Test dose was given through epidural catheter and negative prior to continuing to dose epidural or start infusion. Warning signs of high block given to the patient including shortness of breath, tingling/numbness in hands, complete  motor block, or any concerning symptoms with instructions to call for help. Patient was given instructions on fall risk and not to get out of bed. All questions and concerns addressed with instructions to call with any issues or inadequate analgesia.  Reason for block:procedure for pain

## 2019-07-18 NOTE — Progress Notes (Signed)
   HIGH-RISK PREGNANCY VISIT Patient name: Sara [name] MRN [id number]  Date of birth: 09/08/1995 Chief Complaint:   Routine Prenatal Visit (BPP)  History of Present Illness:   Sara [name] is a 24 y.o. G3P1001 female at [redacted]w[redacted]d with an Estimated Date of Delivery: 07/30/19 being seen today for ongoing management of a high-risk pregnancy complicated by A2DM currently on Metformin 1000mg  BID Today she reports no complaints. Contractions: Irregular. Vag. Bleeding: None.  Movement: Present. reports leaking of fluid.  Review of Systems:   Pertinent items are noted in HPI Denies abnormal vaginal discharge w/ itching/odor/irritation, headaches, visual changes, shortness of breath, chest pain, abdominal pain, severe nausea/vomiting, or problems with urination or bowel movements unless otherwise stated above. Pertinent History Reviewed:  Reviewed past medical,surgical, social, obstetrical and family history.  Reviewed problem list, medications and allergies. Physical Assessment:   Vitals:   07/18/19 0916  BP: 123/81  Pulse: 98  Weight: 161 lb (73 kg)  Body mass index is 28.52 kg/m.           Physical Examination:   General appearance: alert, well appearing, and in no distress  Mental status: alert, oriented to person, place, and time  Skin: warm & dry   Extremities: Edema: None    Cardiovascular: normal heart rate noted  Respiratory: normal respiratory effort, no distress  Abdomen: gravid, soft, non-tender  Pelvic: Cervical exam performed  Dilation: 5.5 Effacement (%): 80 Station: -2SSE:  neg pooling, neg valsalva, neg fern.  Normal mucousy appearing DC  Fetal Status: Fetal Heart Rate (bpm): 148   Movement: Present Presentation: Vertex  Fetal Surveillance Testing today: 09/17/19 38+2 wks, cephalic,BPP 6/8 (no tone),posterior placenta gr 3,fhr 146 bpm,afi 13.7 cm,discussed results w/Fran  NST: FHR baseline 140 bpm, Variability: moderate, Accelerations:present, Decelerations:  Absent= Cat  1/Reactive    Results for orders placed or performed in visit on 07/18/19 (from the past 24 hour(s))  POC Urinalysis Dipstick OB   Collection Time: 07/18/19  9:28 AM  Result Value Ref Range   Color, UA     Clarity, UA     Glucose, UA Negative Negative   Bilirubin, UA     Ketones, UA neg    Spec Grav, UA     Blood, UA neg    pH, UA     POC,PROTEIN,UA Negative Negative, Trace, Small (1+), Moderate (2+), Large (3+), 4+   Urobilinogen, UA     Nitrite, UA neg    Leukocytes, UA Negative Negative   Appearance     Odor      Assessment & Plan:  1) High-risk pregnancy G2P1001 at [redacted]w[redacted]d with an Estimated Date of Delivery: 07/30/19   2) A2DM, stable Treatment plan  Weekly BPP/NST  3) 8/10 BPP, probably early labor.  Ctx q 6 minutes. Go to MAU when/if they become stronger/closer together  Meds: No orders of the defined types were placed in this encounter.   Labs/procedures today: BPP/NST   Reviewed: Term labor symptoms and general obstetric precautions including but not limited to vaginal bleeding, contractions, leaking of fluid and fetal movement were reviewed in detail with the patient.  All questions were answered.  home bp cuff.   Follow-up:   Future Appointments  Date Time Provider Department Center  07/22/2019 12:30 PM [facility] NURSE [facility]    Orders Placed This Encounter  Procedures  . POC Urinalysis Dipstick OB   09/21/2019 DNP, CNM 07/18/2019 9:58 AM

## 2019-07-18 NOTE — Discharge Summary (Signed)
Postpartum Discharge Summary     Patient Name: Sara [name] DOB: 1996-01-09 MRN: [id number]  Date of admission: 07/18/2019 Delivering Provider: Alroy Bailiff   Date of discharge: 07/20/2019  Admitting diagnosis: White classification A2 gestational diabetes mellitus [O24.419] Intrauterine pregnancy: [redacted]w[redacted]d    Secondary diagnosis:  Active Problems:   Depression with anxiety   History of postpartum hemorrhage   Gestational diabetes mellitus, class A2   White classification A2 gestational diabetes mellitus  Additional problems: None     Discharge diagnosis: Term Pregnancy Delivered and GDM A2                                                                                                Post partum procedures:None  Augmentation: AROM  Complications: None  Hospital course:  Onset of Labor With Vaginal Delivery     24y.o. yo G2P1001 at 312w2das admitted in Active Labor on 07/18/2019. Patient had an uncomplicated labor course as follows: Initial SVE: Presented with contractions at 6cm/90%/-2 station. AROM with clear fluid at 1522. She then progressed to complete. Due to hx of PHBlaine Asc LLCTXA given before delivery.  Membrane Rupture Time/Date: 3:22 PM ,07/18/2019   Intrapartum Procedures: Episiotomy: None [1]                                         Lacerations:  None [1]  Patient had a delivery of a Viable infant. 07/18/2019  Information for the patient's newborn:  BaMirriam, Vadala0[[id number]]Delivery Method: [place] an uncomplicated postpartum course. IUD inserted after delivery. Fasting AM glucose 86. She is ambulating, tolerating a regular diet, passing flatus, and urinating well. Patient is discharged home in stable condition on 07/20/19.  Delivery time: 3:54 PM    Magnesium Sulfate received: No BMZ received: No Rhophylac:No MMR:No Transfusion:No  Physical exam  Vitals:   07/19/19 0709 07/19/19 1450 07/19/19 2111 07/20/19 0523  BP: 120/78 121/77 129/88  128/83  Pulse: 79 81 74 65  Resp: '18 18 16 18  ' Temp: 98 F (36.7 C) 98.1 F (36.7 C) 99 F (37.2 C) 98.1 F (36.7 C)  TempSrc: Oral Oral Oral Oral  SpO2: 100% 97% 98%   Weight:      Height:       General: alert, cooperative and no distress Lochia: appropriate Uterine Fundus: firm Incision: Healing well with no significant drainage DVT Evaluation: No evidence of DVT seen on physical exam. Labs: Lab Results  Component Value Date   WBC 13.0 (H) 07/18/2019   HGB 14.7 07/18/2019   HCT 42.6 07/18/2019   MCV 92.4 07/18/2019   PLT 202 07/18/2019   CMP Latest Ref Rng & Units 04/02/2016  Glucose 65 - 99 mg/dL 118(H)  BUN 6 - 20 mg/dL 8  Creatinine 0.44 - 1.00 mg/dL 0.60  Sodium 135 - 145 mmol/L 136  Potassium 3.5 - 5.1 mmol/L 3.7  Chloride 101 - 111 mmol/L 103  CO2 22 - 32  mmol/L 21(L)  Calcium 8.9 - 10.3 mg/dL 9.4  Total Protein 6.5 - 8.1 g/dL 6.7  Total Bilirubin 0.3 - 1.2 mg/dL 1.2  Alkaline Phos 38 - 126 U/L 151(H)  AST 15 - 41 U/L 21  ALT 14 - 54 U/L 10(L)   Edinburgh Score: Edinburgh Postnatal Depression Scale Screening Tool 07/19/2019  I have been able to laugh and see the funny side of things. 0  I have looked forward with enjoyment to things. 0  I have blamed myself unnecessarily when things went wrong. 1  I have been anxious or worried for no good reason. 2  I have felt scared or panicky for no good reason. 1  Things have been getting on top of me. 0  I have been so unhappy that I have had difficulty sleeping. 0  I have felt sad or miserable. 0  I have been so unhappy that I have been crying. 0  The thought of harming myself has occurred to me. 0  Edinburgh Postnatal Depression Scale Total 4    Discharge instruction: per After Visit Summary and "Baby and Me Booklet".  After visit meds:  Allergies as of 07/20/2019   No Known Allergies     Medication List    STOP taking these medications   Accu-Chek Guide Me w/Device Kit   Accu-Chek Guide test  strip Generic drug: glucose blood   Accu-Chek Softclix Lancets lancets   Blood Pressure Monitor Misc   metFORMIN 1000 MG tablet Commonly known as: GLUCOPHAGE     TAKE these medications   ibuprofen 600 MG tablet Commonly known as: ADVIL Take 1 tablet (600 mg total) by mouth every 8 (eight) hours as needed for mild pain, moderate pain or cramping.   PrePLUS 27-1 MG Tabs Take 1 tablet by mouth daily.       Diet: routine diet  Activity: Advance as tolerated. Pelvic rest for 6 weeks.   Outpatient follow up:4 weeks Follow up Appt: Future Appointments  Date Time Provider [place]  07/21/2019  9:30 AM [facility] None  08/22/2019  1:50 PM Roma Schanz, CNM [facility]   Follow up Visit: Follow-up Information    [facility]. Schedule an appointment as soon as possible for a visit.   Specialty: Obstetrics and Gynecology Why: 4-6wks for postpartum visit Contact information: [address] [place] [phone]            Please schedule this patient for Postpartum visit in: 4 weeks with the following provider: Any provider Virtual For C/S patients schedule nurse incision check in weeks 2 weeks: no High risk pregnancy complicated by: GDM Delivery mode:  SVD Anticipated Birth Control:  IUD PP Procedures needed: GTT and IUD string check  Schedule Integrated [place] visit: no     Newborn Data: Live born female  Birth Weight: 3095 g   APGAR: 47, 9  Newborn Delivery   Birth date/time: 07/18/2019 15:54:00 Delivery type: Vaginal, Spontaneous      Baby Feeding: Breast Disposition:home with mother   07/20/2019 Roma Schanz, CNM

## 2019-07-18 NOTE — Patient Instructions (Signed)
[facility] hospital Entrance C [address] [place]

## 2019-07-18 NOTE — Progress Notes (Signed)
Labor Progress Note CHLOEANN ALFRED is a 24 y.o. G2P1001 at [redacted]w[redacted]d presented for SOL  S: Feeling contractions but less pain s/p epidural  O:  BP 115/70   Pulse 77   Temp 98.9 F (37.2 C) (Oral)   Resp 18   Ht 5\' 3"  (1.6 m)   Wt 73.6 kg   LMP 10/23/2018   SpO2 98%   BMI 28.73 kg/m  FHT: 145 bpm, moderate variability, no accels, one decel present for 1 minute that coincided with AROM.   CVE: Dilation: 9 Effacement (%): 100 Station: Plus 1 Presentation: Vertex Exam by:: dr [email or web address]   A&P: 24 y.o. G2P1001 [redacted]w[redacted]d for SOL #Labor: Continue expectant management. S/p epidural. S/p AROM. Expect vaginal delivery.  #Pain: s/p epidural #FWB: Cat II, EFW 3600g #GBS negative  [redacted]w[redacted]d, DO 3:28 PM

## 2019-07-18 NOTE — Anesthesia Preprocedure Evaluation (Signed)
Anesthesia Evaluation  Patient identified by MRN, date of birth, ID band Patient awake    Reviewed: Allergy & Precautions, Patient's Chart, lab work & pertinent test results  History of Anesthesia Complications Negative for: history of anesthetic complications  Airway Mallampati: II  TM Distance: >3 FB Neck ROM: Full    Dental no notable dental hx.    Pulmonary asthma , former smoker,    Pulmonary exam normal        Cardiovascular negative cardio ROS Normal cardiovascular exam     Neuro/Psych Anxiety Depression negative neurological ROS     GI/Hepatic negative GI ROS, Neg liver ROS,   Endo/Other  diabetes, Gestational  Renal/GU negative Renal ROS  negative genitourinary   Musculoskeletal negative musculoskeletal ROS (+)   Abdominal   Peds  Hematology negative hematology ROS (+)   Anesthesia Other Findings Day of surgery medications reviewed with patient.  Reproductive/Obstetrics (+) Pregnancy                             Anesthesia Physical Anesthesia Plan  ASA: II  Anesthesia Plan: Epidural   Post-op Pain Management:    Induction:   PONV Risk Score and Plan: Treatment may vary due to age or medical condition  Airway Management Planned: Natural Airway  Additional Equipment:   Intra-op Plan:   Post-operative Plan:   Informed Consent: I have reviewed the patients History and Physical, chart, labs and discussed the procedure including the risks, benefits and alternatives for the proposed anesthesia with the patient or authorized representative who has indicated his/her understanding and acceptance.       Plan Discussed with:   Anesthesia Plan Comments:         Anesthesia Quick Evaluation

## 2019-07-18 NOTE — Progress Notes (Signed)
[place] 38+2 wks, cephalic,BPP 6/8 (no tone),posterior placenta gr 3,fhr 146 bpm,afi 13.7 cm,discussed results w/Fran

## 2019-07-18 NOTE — Progress Notes (Signed)
Dr Earlene Plater notified of pt's VE, FHR pattern and contraction pattern states he will put in admission orders

## 2019-07-19 ENCOUNTER — Encounter (HOSPITAL_COMMUNITY): Payer: Self-pay | Admitting: Family Medicine

## 2019-07-19 ENCOUNTER — Other Ambulatory Visit: Payer: Medicaid Other | Admitting: Obstetrics & Gynecology

## 2019-07-19 LAB — RPR: RPR Ser Ql: NONREACTIVE

## 2019-07-19 NOTE — Progress Notes (Signed)
Post Partum Day 1 Subjective: no complaints, up ad lib, voiding and tolerating PO, small lochia, plans to breastfeed, IUD  Placed after delivery  Objective: Blood pressure 119/88, pulse 79, temperature 98.3 F (36.8 C), temperature source Oral, resp. rate 18, height 5\' 3"  (1.6 m), weight 73.6 kg, last menstrual period 10/23/2018, SpO2 98 %, unknown if currently breastfeeding.  Physical Exam:  General: alert, cooperative and no distress Lochia:normal flow Chest: CTAB Heart: RRR no m/r/g Abdomen: +BS, soft, nontender,  Uterine Fundus: firm DVT Evaluation: No evidence of DVT seen on physical exam. Extremities: no edema  Recent Labs    07/18/19 1250  HGB 14.7  HCT 42.6    Assessment/Plan: Plan for discharge tomorrow   LOS: 1 day   09/17/19 07/19/2019, 6:57 AM

## 2019-07-19 NOTE — Lactation Note (Signed)
This note was copied from a baby's chart. Lactation Consultation Note  Patient Name: Sara [name] [id number] Date: 07/19/2019 Reason for consult: Initial assessment;Early term 37-38.6wks;Infant weight loss  Baby is 19 hours old  Baby asleep as LC entered the room , per mom the baby last fed at 9 am for 19 mins with swallows per dad.  LC was reviewing basics of breast feeding and baby woke up and was showing feeding cues.  LC offered to assess feeding and guided mom in the cross cradle position on the left breast.  Depth obtained, and baby noted to sustain the latch for 15 mins with increased swallows and  Rhythmic sucking pattern. When baby released the nipple was well rounded and per mom  Comfortable.  Per mom her 1st baby would never latch well and she pumped for 1 month, milk came in well  And then dried up within 1 month. Mom couldn't remember how many times a day she was  Pumping.  LC reviewed supply and demand/ importance of feeding her baby 8 -12 times a day around the  Clock at 1st until her milk is well established.  LC reviewed sore nipple and engorgement prevention and tx.  LC provided, instructed mom on the use of the hand pump and noted the #24 F is a good  Fit, #27 F also given for when the milk comes in.  LC discussed nutritive and  non - nutritive feeding patterns and the importance for watching  The baby hanging out latched.  LC stressed the importance of STS feedings until the baby is back to birth weight, gaining steadily and can stay awake for majority of the feeding.  LC provided an [facility] pamphlet with phone numbers.  Per mom  has a DEBP at home.  Dad mentioned they might beable to go home later today.   Maternal Data Has patient been taught Hand Expression?: Yes Does the patient have breastfeeding experience prior to this delivery?: Yes  Feeding Feeding Type: Breast Fed  LATCH Score Latch: Grasps breast easily, tongue down, lips flanged, rhythmical  sucking.  Audible Swallowing: Spontaneous and intermittent  Type of Nipple: Everted at rest and after stimulation  Comfort (Breast/Nipple): Soft / non-tender  Hold (Positioning): Assistance needed to correctly position infant at breast and maintain latch.  LATCH Score: 9  Interventions Interventions: Breast feeding basics reviewed;Assisted with latch;Skin to skin;Breast massage;Hand express;Breast compression;Adjust position;Support pillows;Position options;Hand pump  Lactation Tools Discussed/Used Tools: Pump;Flanges Flange Size: 24;27 Breast pump type: Manual WIC Program: No Pump Review: Milk Storage;Setup, frequency, and cleaning Initiated by:: MAI Date initiated:: 07/19/19   Consult Status Consult Status: Follow-up Date: 07/20/19 Follow-up type: In-patient    Sara [name] 07/19/2019, 11:25 AM

## 2019-07-19 NOTE — Clinical Social Work Maternal (Signed)
CLINICAL SOCIAL WORK MATERNAL/CHILD NOTE  Patient Details  Name: Sara [name] MRN: [id number] Date of Birth: [birth year]  Date:  07/19/2019  Clinical Social Worker Initiating Note:  Durward Fortes, LCSW Date/Time: Initiated:  07/19/19/0915     Child's Name:  Sara [name]   Biological Parents:  Mother(Sara [name])   Need for Interpreter:  None   Reason for Referral:  Behavioral Health Concerns   Address:  [address] [place] [address]    Phone number:  [phone] (home)     Additional phone number: none   Household Members/Support Persons (HM/SP):   Household Member/Support Person 2   HM/SP Name Relationship DOB or Age  HM/SP -1   Sara [name]  FOB   25  HM/SP -2 Sara [name] MOB  22  HM/SP -3   Sara [name]  son     HM/SP -4        HM/SP -5        HM/SP -6        HM/SP -7        HM/SP -8          Natural Supports (not living in the home):  Parent   Professional Supports: None   Employment: Full-time   Type of Work: Psychologist, counselling   Education:  [employer] school graduate   Homebound arranged:  n/a  Museum/gallery curator Resources:  Medicaid   Other Resources:  WIC(MOB reports a plan to apply for [facility].)   Cultural/Religious Considerations Which May Impact Care:  none reported.   Strengths:  Ability to meet basic needs , Compliance with medical plan , Home prepared for child , Pediatrician chosen   Psychotropic Medications:    MOB reports taking medications in the past, however nothing at this time.   Pediatrician:    [facility]  Pediatrician List:   [facility] Other([facility]  [facility]      Pediatrician Fax Number:    Risk Factors/Current Problems:  None   Cognitive State:  Able to Concentrate , Insightful , Alert    Mood/Affect:  Relaxed , Comfortable , Calm , Interested    CSW Assessment: CSW consulted as MOB has a hx of  depression and anxiety. While chart reviewing, CSW noted that MOB has positive UDS on 01/20/19 for THC. CSW went to speak with MOB at bedside to address further needs.   CSW congratulated MOB on the birth of infant. CSW noted that MOB was nursing infant, therefore offered to return at a later time. MOB assured CSW that it was fine for CSW to come in and speak with her. CSW understanding and advised MOB of CSW's role and the reason for CSW coming to visit with her. MOB reported that after the birth of her last son in 2018, she was diagnosed with depression and anxiety. MOB reports that she was placed on medications at that time, however stopped taking the medications as "they didn't help". MOB reported that her signs and symptoms of depression and anxiety resolved on their own. CSW understanding of this and offered therapy resources as MOB reported that she has never been in therapy, MOB declined the need for resources at this time current time. MOB reported that she isn't SI or HI and denies DV.   CSW inquired from [facility] [name] on her THC use during this pregnancy. MOB reported that her last use  was in November and reported none since. MOB reported that she seen her positive screen as well. CSW understanding and advised MOB of the hospital drug screen policy. MOB reported that she understood the policy and reported no further questions regarding the screen. CSW did encouraged MOB to ensure that cotton balls remain in infants pamper. MOB was informed that if UDS or CDS is positive for infant, then CSW would need to make a CPS report. MOB denied any CPS hx of current involvement.   CSW took time to provide MOB with PPD and SIDS education. MOB was given PPD Checklist in order for MOB to keep track of her feelings as they relate to PPD. MOB reported that she has all needed items to care for infant and reports that she has a basinet for infant to sleep in once arrived home. MOB plans for infant to get follow up care at  [facility]   CSW will continue to monitor infants CDS and UDS and make CPS report if warranted.   CSW Plan/Description:  No Further Intervention Required/No Barriers to Discharge, Sudden Infant Death Syndrome (SIDS) Education, Perinatal Mood and Anxiety Disorder (PMADs) Education, Hospital Drug Screen Policy Information, CSW Will Continue to Monitor Umbilical Cord Tissue Drug Screen Results and Make Report if Celso Sickle, LCSWA 07/19/2019, 9:24 AM

## 2019-07-20 LAB — GLUCOSE, CAPILLARY: Glucose-Capillary: 86 mg/dL (ref 70–99)

## 2019-07-20 MED ORDER — IBUPROFEN 600 MG PO TABS: 600.0000 mg | ORAL_TABLET | Freq: Three times a day (TID) | ORAL | 0 refills | Status: DC | PRN

## 2019-07-20 NOTE — Lactation Note (Addendum)
This note was copied from a baby's chart. Lactation Consultation Note  Patient Name: Sara [name] [id number] Date: 07/20/2019 Reason for consult: Follow-up assessment;Early term 37-38.6wks;Infant weight loss Baby is 40 hours / 6 % weight loss  As LC entered the room mom changing a wet and transitional stool .  LC reviewed and updated the doc flow sheets per mom. Reviewing the doc flow sheets baby has been consistent with breast feeding.  Per mom hearing more swallows and nipples are sensitive. While assisting  With latch assessed breast tissue and no nipple breakdown noted.  LC discussed transient nipple soreness with mom as being normal sign of milk coming in.  Sore nipple and engorgement prevention and tx reviewed. LC provided comfort gels for after  The feeding x 6 days, alternating with breast shells while awake to prevent further soreness.  LC recommended prior to latching , breast massage, hand express, pre-pump with hand pump if needed and latch with firm support.  Baby awake and latched easily on the right breast / football / depth achieved / and still feeding at 36 mins with increased swallows. Per mom comfortable. Per mom aware of how to release suction.  LC discussed nutritive vs non - nutritive feeding patterns and the importance of watching the baby for hanging out latched . LC stressed the importance of STS feedings until the baby is  Back to birth weight,gaining steadily and can stay awake for majority of feeding.  LC praised mom for her breast feeding efforts.  Mom aware of the virtual support groups , and has the [facility] pamphlet with phone numbers.    Maternal Data Has patient been taught Hand Expression?: Yes  Feeding Feeding Type: Breast Fed  LATCH Score Latch: Grasps breast easily, tongue down, lips flanged, rhythmical sucking.  Audible Swallowing: Spontaneous and intermittent  Type of Nipple: Everted at rest and after stimulation  Comfort (Breast/Nipple): Filling,  red/small blisters or bruises, mild/mod discomfort  Hold (Positioning): Assistance needed to correctly position infant at breast and maintain latch.  LATCH Score: 8  Interventions Interventions: Breast feeding basics reviewed;Assisted with latch;Skin to skin;Breast massage;Hand express;Reverse pressure;Breast compression;Adjust position;Support pillows;Position options  Lactation Tools Discussed/Used Tools: Pump;Flanges;Shells;Comfort gels Flange Size: 24;27 Shell Type: Inverted Breast pump type: Manual Pump Review: Milk Storage;Setup, frequency, and cleaning   Consult Status Consult Status: Complete Date: 07/20/19 Follow-up type: In-patient    Matilde Sprang Fotini Lemus 07/20/2019, 8:16 AM

## 2019-07-20 NOTE — Discharge Instructions (Signed)
Postpartum Care After Vaginal Delivery This sheet gives you information about how to care for yourself from the time you deliver your baby to up to 6-12 weeks after delivery (postpartum period). Your health care provider may also give you more specific instructions. If you have problems or questions, contact your health care provider. Follow these instructions at home: Vaginal bleeding  It is normal to have vaginal bleeding (lochia) after delivery. Wear a sanitary pad for vaginal bleeding and discharge. ? During the first week after delivery, the amount and appearance of lochia is often similar to a menstrual period. ? Over the next few weeks, it will gradually decrease to a dry, yellow-brown discharge. ? For most women, lochia stops completely by 4-6 weeks after delivery. Vaginal bleeding can vary from woman to woman.  Change your sanitary pads frequently. Watch for any changes in your flow, such as: ? A sudden increase in volume. ? A change in color. ? Large blood clots.  If you pass a blood clot from your vagina, save it and call your health care provider to discuss. Do not flush blood clots down the toilet before talking with your health care provider.  Do not use tampons or douches until your health care provider says this is safe.  If you are not breastfeeding, your period should return 6-8 weeks after delivery. If you are feeding your child breast milk only (exclusive breastfeeding), your period may not return until you stop breastfeeding. Perineal care  Keep the area between the vagina and the anus (perineum) clean and dry as told by your health care provider. Use medicated pads and pain-relieving sprays and creams as directed.  If you had a cut in the perineum (episiotomy) or a tear in the vagina, check the area for signs of infection until you are healed. Check for: ? More redness, swelling, or pain. ? Fluid or blood coming from the cut or tear. ? Warmth. ? Pus or a bad  smell.  You may be given a squirt bottle to use instead of wiping to clean the perineum area after you go to the bathroom. As you start healing, you may use the squirt bottle before wiping yourself. Make sure to wipe gently.  To relieve pain caused by an episiotomy, a tear in the vagina, or swollen veins in the anus (hemorrhoids), try taking a warm sitz bath 2-3 times a day. A sitz bath is a warm water bath that is taken while you are sitting down. The water should only come up to your hips and should cover your buttocks. Breast care  Within the first few days after delivery, your breasts may feel heavy, full, and uncomfortable (breast engorgement). Milk may also leak from your breasts. Your health care provider can suggest ways to help relieve the discomfort. Breast engorgement should go away within a few days.  If you are breastfeeding: ? Wear a bra that supports your breasts and fits you well. ? Keep your nipples clean and dry. Apply creams and ointments as told by your health care provider. ? You may need to use breast pads to absorb milk that leaks from your breasts. ? You may have uterine contractions every time you breastfeed for up to several weeks after delivery. Uterine contractions help your uterus return to its normal size. ? If you have any problems with breastfeeding, work with your health care provider or lactation consultant.  If you are not breastfeeding: ? Avoid touching your breasts a lot. Doing this can make   your breasts produce more milk. ? Wear a good-fitting bra and use cold packs to help with swelling. ? Do not squeeze out (express) milk. This causes you to make more milk. Intimacy and sexuality  Ask your health care provider when you can engage in sexual activity. This may depend on: ? Your risk of infection. ? How fast you are healing. ? Your comfort and desire to engage in sexual activity.  You are able to get pregnant after delivery, even if you have not had  your period. If desired, talk with your health care provider about methods of birth control (contraception). Medicines  Take over-the-counter and prescription medicines only as told by your health care provider.  If you were prescribed an antibiotic medicine, take it as told by your health care provider. Do not stop taking the antibiotic even if you start to feel better. Activity  Gradually return to your normal activities as told by your health care provider. Ask your health care provider what activities are safe for you.  Rest as much as possible. Try to rest or take a nap while your baby is sleeping. Eating and drinking   Drink enough fluid to keep your urine pale yellow.  Eat high-fiber foods every day. These may help prevent or relieve constipation. High-fiber foods include: ? Whole grain cereals and breads. ? Brown rice. ? Beans. ? Fresh fruits and vegetables.  Do not try to lose weight quickly by cutting back on calories.  Take your prenatal vitamins until your postpartum checkup or until your health care provider tells you it is okay to stop. Lifestyle  Do not use any products that contain nicotine or tobacco, such as cigarettes and e-cigarettes. If you need help quitting, ask your health care provider.  Do not drink alcohol, especially if you are breastfeeding. General instructions  Keep all follow-up visits for you and your baby as told by your health care provider. Most women visit their health care provider for a postpartum checkup within the first 3-6 weeks after delivery. Contact a health care provider if:  You feel unable to cope with the changes that your child brings to your life, and these feelings do not go away.  You feel unusually sad or worried.  Your breasts become red, painful, or hard.  You have a fever.  You have trouble holding urine or keeping urine from leaking.  You have little or no interest in activities you used to enjoy.  You have not  breastfed at all and you have not had a menstrual period for 12 weeks after delivery.  You have stopped breastfeeding and you have not had a menstrual period for 12 weeks after you stopped breastfeeding.  You have questions about caring for yourself or your baby.  You pass a blood clot from your vagina. Get help right away if:  You have chest pain.  You have difficulty breathing.  You have sudden, severe leg pain.  You have severe pain or cramping in your lower abdomen.  You bleed from your vagina so much that you fill more than one sanitary pad in one hour. Bleeding should not be heavier than your heaviest period.  You develop a severe headache.  You faint.  You have blurred vision or spots in your vision.  You have bad-smelling vaginal discharge.  You have thoughts about hurting yourself or your baby. If you ever feel like you may hurt yourself or others, or have thoughts about taking your own life, get help  vagina so much that you fill more than one sanitary pad in one hour. Bleeding should not be heavier than your heaviest period.  · You develop a severe headache.  · You faint.  · You have blurred vision or spots in your vision.  · You have bad-smelling vaginal discharge.  · You have thoughts about hurting yourself or your baby.  If you ever feel like you may hurt yourself or others, or have thoughts about taking your own life, get help right away. You can go to the nearest emergency department or call:  · Your local emergency services (911 in the [place]  · A suicide crisis helpline, such as the National Suicide Prevention Lifeline at 1-800-273-8255. This is open 24 hours a day.  Summary  · The period of time right after you deliver your newborn up to 6-12 weeks after delivery is called the postpartum period.  · Gradually return to your normal activities as told by your health care provider.  · Keep all follow-up visits for you and your baby as told by your health care provider.  This information is not intended to replace advice given to you by your health care provider. Make sure you discuss any questions you have with your health care provider.  Document Revised: 03/06/2017 Document Reviewed: 12/15/2016  Elsevier Patient Education © 2020 [employer]  Breastfeeding    Choosing to breastfeed is one of the best decisions you can make for yourself and your baby. A change in hormones during pregnancy causes your  breasts to make breast milk in your milk-producing glands. Hormones prevent breast milk from being released before your baby is born. They also prompt milk flow after birth. Once breastfeeding has begun, thoughts of your baby, as well as his or her sucking or crying, can stimulate the release of milk from your milk-producing glands.  Benefits of breastfeeding  Research shows that breastfeeding offers many health benefits for infants and mothers. It also offers a cost-free and convenient way to feed your baby.  For your baby  · Your first milk (colostrum) helps your baby's digestive system to function better.  · Special cells in your milk (antibodies) help your baby to fight off infections.  · Breastfed babies are less likely to develop asthma, allergies, obesity, or type 2 diabetes. They are also at lower risk for sudden infant death syndrome (SIDS).  · Nutrients in breast milk are better able to meet your baby’s needs compared to infant formula.  · Breast milk improves your baby's brain development.  For you  · Breastfeeding helps to create a very special bond between you and your baby.  · Breastfeeding is convenient. Breast milk costs nothing and is always available at the correct temperature.  · Breastfeeding helps to burn calories. It helps you to lose the weight that you gained during pregnancy.  · Breastfeeding makes your uterus return faster to its size before pregnancy. It also slows bleeding (lochia) after you give birth.  · Breastfeeding helps to lower your risk of developing type 2 diabetes, osteoporosis, rheumatoid arthritis, cardiovascular disease, and breast, ovarian, uterine, and endometrial cancer later in life.  Breastfeeding basics  Starting breastfeeding  · Find a comfortable place to sit or lie down, with your neck and back well-supported.  · Place a pillow or a rolled-up blanket under your baby to bring him or her to the level of your breast (if you are seated). Nursing pillows are specially  designed to help support your arms   and your baby while you breastfeed.  · Make sure that your baby's tummy (abdomen) is facing your abdomen.  · Gently massage your breast. With your fingertips, massage from the outer edges of your breast inward toward the nipple. This encourages milk flow. If your milk flows slowly, you may need to continue this action during the feeding.  · Support your breast with 4 fingers underneath and your thumb above your nipple (make the letter "C" with your hand). Make sure your fingers are well away from your nipple and your baby’s mouth.  · Stroke your baby's lips gently with your finger or nipple.  · When your baby's mouth is open wide enough, quickly bring your baby to your breast, placing your entire nipple and as much of the areola as possible into your baby's mouth. The areola is the colored area around your nipple.  ? More areola should be visible above your baby's upper lip than below the lower lip.  ? Your baby's lips should be opened and extended outward (flanged) to ensure an adequate, comfortable latch.  ? Your baby's tongue should be between his or her lower gum and your breast.  · Make sure that your baby's mouth is correctly positioned around your nipple (latched). Your baby's lips should create a seal on your breast and be turned out (everted).  · It is common for your baby to suck about 2-3 minutes in order to start the flow of breast milk.  Latching  Teaching your baby how to latch onto your breast properly is very important. An improper latch can cause nipple pain, decreased milk supply, and poor weight gain in your baby. Also, if your baby is not latched onto your nipple properly, he or she may swallow some air during feeding. This can make your baby fussy. Burping your baby when you switch breasts during the feeding can help to get rid of the air. However, teaching your baby to latch on properly is still the best way to prevent fussiness from swallowing air while  breastfeeding.  Signs that your baby has successfully latched onto your nipple  · Silent tugging or silent sucking, without causing you pain. Infant's lips should be extended outward (flanged).  · Swallowing heard between every 3-4 sucks once your milk has started to flow (after your let-down milk reflex occurs).  · Muscle movement above and in front of his or her ears while sucking.  Signs that your baby has not successfully latched onto your nipple  · Sucking sounds or smacking sounds from your baby while breastfeeding.  · Nipple pain.  If you think your baby has not latched on correctly, slip your finger into the corner of your baby’s mouth to break the suction and place it between your baby's gums. Attempt to start breastfeeding again.  Signs of successful breastfeeding  Signs from your baby  · Your baby will gradually decrease the number of sucks or will completely stop sucking.  · Your baby will fall asleep.  · Your baby's body will relax.  · Your baby will retain a small amount of milk in his or her mouth.  · Your baby will let go of your breast by himself or herself.  Signs from you  · Breasts that have increased in firmness, weight, and size 1-3 hours after feeding.  · Breasts that are softer immediately after breastfeeding.  · Increased milk volume, as well as a change in milk consistency and color by the fifth day of breastfeeding.  ·   Nipples that are not sore, cracked, or bleeding.  Signs that your baby is getting enough milk  · Wetting at least 1-2 diapers during the first 24 hours after birth.  · Wetting at least 5-6 diapers every 24 hours for the first week after birth. The urine should be clear or pale yellow by the age of 5 days.  · Wetting 6-8 diapers every 24 hours as your baby continues to grow and develop.  · At least 3 stools in a 24-hour period by the age of 5 days. The stool should be soft and yellow.  · At least 3 stools in a 24-hour period by the age of 7 days. The stool should be seedy and  yellow.  · No loss of weight greater than 10% of birth weight during the first 3 days of life.  · Average weight gain of 4-7 oz (113-198 g) per week after the age of 4 days.  · Consistent daily weight gain by the age of 5 days, without weight loss after the age of 2 weeks.  After a feeding, your baby may spit up a small amount of milk. This is normal.  Breastfeeding frequency and duration  Frequent feeding will help you make more milk and can prevent sore nipples and extremely full breasts (breast engorgement). Breastfeed when you feel the need to reduce the fullness of your breasts or when your baby shows signs of hunger. This is called "breastfeeding on demand." Signs that your baby is hungry include:  · Increased alertness, activity, or restlessness.  · Movement of the head from side to side.  · Opening of the mouth when the corner of the mouth or cheek is stroked (rooting).  · Increased sucking sounds, smacking lips, cooing, sighing, or squeaking.  · Hand-to-mouth movements and sucking on fingers or hands.  · Fussing or crying.  Avoid introducing a pacifier to your baby in the first 4-6 weeks after your baby is born. After this time, you may choose to use a pacifier. Research has shown that pacifier use during the first year of a baby's life decreases the risk of sudden infant death syndrome (SIDS).  Allow your baby to feed on each breast as long as he or she wants. When your baby unlatches or falls asleep while feeding from the first breast, offer the second breast. Because newborns are often sleepy in the first few weeks of life, you may need to awaken your baby to get him or her to feed.  Breastfeeding times will vary from baby to baby. However, the following rules can serve as a guide to help you make sure that your baby is properly fed:  · Newborns (babies 4 weeks of age or younger) may breastfeed every 1-3 hours.  · Newborns should not go without breastfeeding for longer than 3 hours during the day or 5  hours during the night.  · You should breastfeed your baby a minimum of 8 times in a 24-hour period.  Breast milk pumping         Pumping and storing breast milk allows you to make sure that your baby is exclusively fed your breast milk, even at times when you are unable to breastfeed. This is especially important if you go back to work while you are still breastfeeding, or if you are not able to be present during feedings. Your lactation consultant can help you find a method of pumping that works best for you and give you guidelines about how long   after you give birth. The following recommendations can help to ease engorgement:  Completely empty your breasts while breastfeeding or pumping. You may want to start by applying warm, moist heat (in the shower or with warm, water-soaked hand towels) just before feeding or pumping. This increases circulation and helps the  milk flow. If your baby does not completely empty your breasts while breastfeeding, pump any extra milk after he or she is finished.  Apply ice packs to your breasts immediately after breastfeeding or pumping, unless this is too uncomfortable for you. To do this: ? Put ice in a plastic bag. ? Place a towel between your skin and the bag. ? Leave the ice on for 20 minutes, 2-3 times a day.  Make sure that your baby is latched on and positioned properly while breastfeeding. If engorgement persists after 48 hours of following these recommendations, contact your health care provider or a [employer]. Overall health care recommendations while breastfeeding  Eat 3 healthy meals and 3 snacks every day. Well-nourished mothers who are breastfeeding need an additional 450-500 calories a day. You can meet this requirement by increasing the amount of a balanced diet that you eat.  Drink enough water to keep your urine pale yellow or clear.  Rest often, relax, and continue to take your prenatal vitamins to prevent fatigue, stress, and low vitamin and mineral levels in your body (nutrient deficiencies).  Do not use any products that contain nicotine or tobacco, such as cigarettes and e-cigarettes. Your baby may be harmed by chemicals from cigarettes that pass into breast milk and exposure to secondhand smoke. If you need help quitting, ask your health care provider.  Avoid alcohol.  Do not use illegal drugs or marijuana.  Talk with your health care provider before taking any medicines. These include over-the-counter and prescription medicines as well as vitamins and herbal supplements. Some medicines that may be harmful to your baby can pass through breast milk.  It is possible to become pregnant while breastfeeding. If birth control is desired, ask your health care provider about options that will be safe while breastfeeding your baby. Where to find more information: [employer]: www.llli.org Contact a health care provider if:  You feel like you want to stop breastfeeding or have become frustrated with breastfeeding.  Your nipples are cracked or bleeding.  Your breasts are red, tender, or warm.  You have: ? Painful breasts or nipples. ? A swollen area on either breast. ? A fever or chills. ? Nausea or vomiting. ? Drainage other than breast milk from your nipples.  Your breasts do not become full before feedings by the fifth day after you give birth.  You feel sad and depressed.  Your baby is: ? Too sleepy to eat well. ? Having trouble sleeping. ? More than 72 week old and wetting fewer than 6 diapers in a 24-hour period. ? Not gaining weight by 49 days of age.  Your baby has fewer than 3 stools in a 24-hour period.  Your baby's skin or the white parts of his or her eyes become yellow. Get help right away if:  Your baby is overly tired (lethargic) and does not want to wake up and feed.  Your baby develops an unexplained fever. Summary  Breastfeeding offers many health benefits for infant and mothers.  Try to breastfeed your infant when he or she shows early signs of hunger.  Gently tickle or stroke your baby's lips with your finger or nipple  to allow the baby to open his or her mouth. Bring the baby to your breast. Make sure that much of the areola is in your baby's mouth. Offer one side and burp the baby before you offer the other side.  Talk with your health care provider or lactation consultant if you have questions or you face problems as you breastfeed. This information is not intended to replace advice given to you by your health care provider. Make sure you discuss any questions you have with your health care provider. Document Revised: 05/28/2017 Document Reviewed: 04/04/2016 Elsevier Patient Education  2020 [employer].

## 2019-07-21 ENCOUNTER — Other Ambulatory Visit (HOSPITAL_COMMUNITY): Payer: Medicaid Other

## 2019-07-22 ENCOUNTER — Other Ambulatory Visit: Payer: Medicaid Other

## 2019-07-23 ENCOUNTER — Inpatient Hospital Stay (HOSPITAL_COMMUNITY)
Admission: AD | Admit: 2019-07-23 | Payer: Medicaid Other | Source: Home / Self Care | Admitting: Obstetrics & Gynecology

## 2019-07-23 ENCOUNTER — Encounter (HOSPITAL_COMMUNITY): Payer: Medicaid Other

## 2019-08-22 ENCOUNTER — Ambulatory Visit (INDEPENDENT_AMBULATORY_CARE_PROVIDER_SITE_OTHER): Payer: Medicaid Other | Admitting: Women's Health

## 2019-08-22 ENCOUNTER — Encounter: Payer: Self-pay | Admitting: Women's Health

## 2019-08-22 DIAGNOSIS — Z8632 Personal history of gestational diabetes: Secondary | ICD-10-CM

## 2019-08-22 DIAGNOSIS — Z1332 Encounter for screening for maternal depression: Secondary | ICD-10-CM | POA: Diagnosis not present

## 2019-08-22 DIAGNOSIS — O99893 Other specified diseases and conditions complicating puerperium: Secondary | ICD-10-CM

## 2019-08-22 DIAGNOSIS — N750 Cyst of Bartholin's gland: Secondary | ICD-10-CM

## 2019-08-22 DIAGNOSIS — Z975 Presence of (intrauterine) contraceptive device: Secondary | ICD-10-CM

## 2019-08-22 NOTE — Patient Instructions (Addendum)
You will have your sugar test next visit.  Please do not eat or drink anything after midnight the night before you come, not even water.  You will be here for at least two hours.  Please make an appointment online for the bloodwork at [email or web address] for 8:30am (or as close to this as possible). Make sure you select the [facility] service center. The day of the appointment, check in with our office first, then you will go to [place] to start the sugar test.     Bartholin's Cyst  A Bartholin's cyst is a fluid-filled sac that forms on a Bartholin's gland. Bartholin's glands are small glands in the folds of skin around the vaginal opening (labia). These glands produce a fluid to moisten (lubricate) the outside of the vagina during sex. A cyst that is not large or infected may not cause any problems or require treatment. If the cyst gets infected, it is called a Bartholin's abscess. An abscess may cause symptoms such as pain and swelling and is more likely to require treatment. What are the causes? This condition may be caused by a blocked Bartholin's gland. These glands can become blocked due to natural buildup of fluid and oils. Bacteria inside of the cyst can cause infection. In many cases, the cause is not known. What increases the risk? You may be at increased risk of developing a Bartholin's cyst or abscess if:  You are of childbearing age.  You have a history of Bartholin's cysts or abscesses.  You have diabetes.  You have an STI (sexually transmitted infection). What are the signs or symptoms? Symptoms may include:  A bulge or lump on the labia, near the lower opening of the vagina.  Discomfort or pain. This may get worse during sex or when walking.  Redness, swelling, or fluid draining from the area. These may be signs of an abscess. How severe your symptoms are depends on the size of your cyst and whether it is infected. Infection causes symptoms to get more severe. How is this  diagnosed? This condition may be diagnosed based on:  Your symptoms and medical history.  A physical exam to check for swelling in your vaginal area. You may lie on your back on an exam table and have your feet placed into footrests for the exam.  Blood tests to check for infections.  Removal of a fluid sample from the cyst or abscess (biopsy) for testing. You may work with a health care provider who specializes in [employer] health (gynecologist) for diagnosis and treatment. How is this treated? If your cyst is small, not infected, and not causing symptoms, you may not need any treatment. These cysts often go away on their own, with home care such as hot baths or warm compresses. If you have a large cyst or an abscess, treatment may include:  Antibiotic medicine.  A procedure to drain the fluid inside the cyst or abscess. These procedures involve making an incision in the cyst or abscess so that the fluid drains out, and then one of the following may be done: ? A small, thin tube (catheter) may be placed inside the cyst or abscess so that it does not close and fill up with fluid again (fistulization). The catheter will be removed at a follow-up visit. ? The edges of the incision may be stitched to your skin so that the cyst or abscess stays open (marsupialization). This allows it to continue to drain and not fill up with fluid again. If  you have cysts or abscesses that keep returning (recurring) and have required incision and drainage multiple times, your health care provider may talk with you about surgery to remove the Bartholin's gland. Follow these instructions at home: Medicines  Take over-the-counter and prescription medicines only as told by your health care provider.  If you were prescribed an antibiotic medicine, take it as told by your health care provider. Do not stop taking the antibiotic even if your condition improves. Managing pain and swelling  Try sitz baths to help with  pain and swelling. A sitz bath is a warm water bath in which the water only comes up to your hips and should cover your buttocks. You may take sitz baths several times a day.  Apply heat to the affected area as often as needed. Use the heat source that your health care provider recommends, such as a moist heat pack or a heating pad. ? Place a towel between your skin and the heat source. ? Leave the heat on for 20-30 minutes. ? Remove the heat if your skin turns bright red. This is especially important if you are unable to feel pain, heat, or cold. You may have a greater risk of getting burned. General instructions  If your cyst or abscess was drained, follow instructions from your health care provider about how to take care of your wound. Use feminine pads as needed to absorb any drainage.  Do not push on or squeeze your cyst.  Do not have sex until the cyst has gone away or your wound from drainage has healed.  Take these steps to help prevent a Bartholin's cyst from returning, and to prevent other Bartholin's cysts from developing: ? Take a bath or shower once a day. Clean your vaginal area with mild soap and water when you bathe. ? Practice safe sex to prevent STIs. Talk with your health care provider about how to prevent STIs and which forms of birth control (contraception) may be best for you.  Keep all follow-up visits as told by your health care provider. This is important. Contact a health care provider if:  You have a fever.  You develop redness, swelling, or pain around your cyst.  You have fluid, blood, pus, or a bad smell coming from your cyst.  You have a cyst that gets larger or comes back. Summary  A Bartholin's cyst is a fluid-filled sac that forms on a Bartholin's gland. These glands are in the folds of skin around the vaginal opening (labia).  If your cyst is small, not infected, and not causing symptoms, you may not need any treatment. These cysts often go away on  their own, with home care such as hot baths or warm compresses.  If you have a large cyst or an abscess, your health care provider may perform a procedure to drain the fluid.  If you have cysts or abscesses that keep returning (recurring) and have required incision and drainage multiple times, your health care provider may talk with you about surgery to remove the Bartholin's gland. This information is not intended to replace advice given to you by your health care provider. Make sure you discuss any questions you have with your health care provider. Document Revised: 12/24/2017 Document Reviewed: 12/03/2016 Elsevier Patient Education  2020 [employer].

## 2019-08-22 NOTE — Progress Notes (Signed)
POSTPARTUM VISIT Patient name: Sara [name] MRN [id number]  Date of birth: 09-20-1995 Chief Complaint:   Postpartum Care  History of Present Illness:   Sara [name] is a 24 y.o. G23P2002 Caucasian female being seen today for a postpartum visit. She is 5 weeks postpartum following a spontaneous vaginal delivery at 38.2 gestational weeks. Anesthesia: epidural.  I have fully reviewed the prenatal and intrapartum course. Liletta IUD placed postplacental on 07/18/19.  Pregnancy uncomplicated. Postpartum course has been uncomplicated. Bleeding thin lochia. Bowel function is normal. Bladder function is normal.  Patient is not sexually active. Last sexual activity: prior to birth of baby.    No LMP recorded. (Menstrual status: IUD).  Baby's course has been uncomplicated. Baby is feeding by bottle    Edinburgh Postpartum Depression Screening:  Edinburgh Postnatal Depression Scale - 08/22/19 1357      Edinburgh Postnatal Depression Scale:  In the Past 7 Days   I have been able to laugh and see the funny side of things.  0    I have looked forward with enjoyment to things.  0    I have blamed myself unnecessarily when things went wrong.  2    I have been anxious or worried for no good reason.  3    I have felt scared or panicky for no good reason.  0    Things have been getting on top of me.  1    I have been so unhappy that I have had difficulty sleeping.  1    I have felt sad or miserable.  1    I have been so unhappy that I have been crying.  1    The thought of harming myself has occurred to me.  0    Edinburgh Postnatal Depression Scale Total  9      Review of Systems:   Pertinent items are noted in HPI Denies Abnormal vaginal discharge w/ itching/odor/irritation, headaches, visual changes, shortness of breath, chest pain, abdominal pain, severe nausea/vomiting, or problems with urination or bowel movements. Pertinent History Reviewed:  Reviewed past medical,surgical, obstetrical and  family history.  Reviewed problem list, medications and allergies. OB History  Gravida Para Term Preterm AB Living  '2 2 2     2  ' SAB TAB Ectopic Multiple Live Births        0 2    # Outcome Date GA Lbr Len/2nd Weight Sex Delivery Anes PTL Lv  2 Term 07/18/19 29w2d08:45 / 00:09 6 lb 13.2 oz (3.095 kg) M Vag-Spont EPI  LIV  1 Term 04/02/16 347w3d0:16 / 01:08 7 lb 4.8 oz (3.31 kg) M Vag-Spont EPI N LIV   Physical Assessment:   Vitals:   08/22/19 1355  BP: 114/73  Pulse: 88  Weight: 149 lb (67.6 kg)  Height: '5\' 3"'  (1.6 m)  Body mass index is 26.39 kg/m.       Physical Examination:   General appearance: alert, well appearing, and in no distress  Mental status: alert, oriented to person, place, and time  Skin: warm & dry   Cardiovascular: normal heart rate noted   Respiratory: normal respiratory effort, no distress   Breasts: deferred, no complaints   Abdomen: soft, non-tender   Pelvic: VULVA: small Rt Bartholin's cyst, nontender, pt states has been there for awhile, no pain w/ sex, VAGINA: normal appearing vagina with normal color and discharge, no lesions, CERVIX: normal appearing cervix without discharge or lesions, IUD strings visible, appropriate length  Rectal: no hemorrhoids  Extremities: no edema       No results found for this or any previous visit (from the past 24 hour(s)).  Assessment & Plan:  1) Postpartum exam 2) 5 wks s/p SVB and pp Liletta IUD insertion 3) Bottlefeeding 4) Depression screening 5) Contraception counseling 6) Nontender Bartholin's cyst>not bothering her at all, let us know if becomes bothersome/tender/larger  Essential components of care per ACOG recommendations:  1.  Mood and well being:  . Patient with negative depression screening today. . Pre-existing mental health disorders? Yes, h/o dep/anx, no meds currently, doing well . Patient does not use tobacco. If using tobacco we discussed reduction/cessation and risk of relapse . Substance  use disorder? No    2. Infant care and feeding:  . Patient currently breastfeeding? No  . Childcare strategy if returning to work/school: pt's dad . Infant has a pediatrician/family doctor? Yes  . Recommended that all caregivers be immunized for flu, pertussis and other preventable communicable diseases . Pt does have material needs met such as stable housing, utilities, food and diapers. If not, referred to local resources for help obtaining these.  3. Sexuality, contraception and birth spacing . Provided guidance regarding sexuality, management of dyspareunia, and resumption of intercourse . Patient does not want a pregnancy in the near future.  Desired family size is 3 children.  . Discussed avoiding interpregnancy interval <31mhs and recommended birth spacing of 18 months  4. Sleep and fatigue . Discussed coping options for fatigue and sleep disruption . Encouraged family/partner/community support of 4 hrs of uninterrupted sleep to help with mood and fatigue  5. Physical recovery  . Pt did not have a cesarean section. If yes, assessed incisional pain and providing guidance on normal vs prolonged recovery . Patient had a none laceration . Patient has urinary incontinence? No, fecal incontinence? No  . Patient is safe to resume physical activity. Discussed attainment of healthy weight.  6.  Chronic disease management . Discussed pregnancy complications if any, and their implications for future childbearing and long-term maternal health. . Review recommendations for prevention of recurrent pregnancy complications, such as 17 hydroxyprogesterone caproate to reduce risk for recurrent PTB not applicable, or aspirin to reduce risk of preeclampsia not applicable. . Pt had GDM: Yes. If yes, 2hr GTT scheduled: yes. . Reviewed medications and non-pregnant dosing including consideration of whether pt is breastfeeding using a reliable resource such as LactMed: not applicable . Referred for f/u w/  PCP or subspecialist providers as indicated: not applicable  7. Health maintenance . Last pap smear 01/20/19 and results were normal . Mammogram at 24yo or earlier if indicated  Meds: No orders of the defined types were placed in this encounter.   Follow-up: Return in about 3 weeks (around 09/12/2019) for 2hr sugar test (no visit).   No orders of the defined types were placed in this encounter.   [place]6/09/2019 2:28 PM

## 2019-09-12 ENCOUNTER — Other Ambulatory Visit: Payer: Medicaid Other

## 2021-05-15 ENCOUNTER — Other Ambulatory Visit: Payer: Self-pay

## 2021-05-15 ENCOUNTER — Ambulatory Visit
Admission: EM | Admit: 2021-05-15 | Discharge: 2021-05-15 | Disposition: A | Discharge status: Home / Self Care | Payer: Medicaid Other | Attending: Family Medicine | Admitting: Family Medicine

## 2021-05-15 ENCOUNTER — Ambulatory Visit (INDEPENDENT_AMBULATORY_CARE_PROVIDER_SITE_OTHER): Payer: Medicaid Other

## 2021-05-15 DIAGNOSIS — W19XXXA Unspecified fall, initial encounter: Secondary | ICD-10-CM

## 2021-05-15 DIAGNOSIS — S8002XA Contusion of left knee, initial encounter: Secondary | ICD-10-CM | POA: Diagnosis not present

## 2021-05-15 DIAGNOSIS — M25562 Pain in left knee: Secondary | ICD-10-CM

## 2021-05-15 MED ORDER — IBUPROFEN 600 MG PO TABS: 600.0000 mg | ORAL_TABLET | Freq: Three times a day (TID) | ORAL | 0 refills | Status: DC | PRN

## 2021-05-15 MED ORDER — CYCLOBENZAPRINE HCL 5 MG PO TABS: 5.0000 mg | ORAL_TABLET | Freq: Three times a day (TID) | ORAL | 0 refills | Status: DC | PRN

## 2021-05-15 NOTE — ED Triage Notes (Signed)
Pt states that her left knee started hurting last night after falling on the walk home ? ?Pt states that when she puts pressure on her left leg it feels like it may give out ? ?Pt states she tried to use ice on it without any relief ?

## 2021-05-15 NOTE — ED Provider Notes (Signed)
?[facility] ? ? ? ?CSN: [id number] ?Arrival date & time: 05/15/21  [id number] ? ? ?  ? ?History   ?Chief Complaint ?Chief Complaint  ?Patient presents with  ? Knee Pain  ?  Left knee pain  ? ? ?HPI ?ICEL CHAKRABARTI is a 26 y.o. female.  ? ?Patient presenting today with acute onset left knee pain following a fall last night directly onto the knee.  She states that she is able to bear weight on the leg but it feels like it is going to give out on her if she fully extends or flexes the leg.  She denies swelling, bruising, deformity, numbness, tingling, discoloration.  So far not tried anything over-the-counter for symptoms. ? ? ?Past Medical History:  ?Diagnosis Date  ? Asthma   ? Kidney stone   ? UTI  ? ? ?Patient Active Problem List  ? Diagnosis Date Noted  ? History of gestational diabetes 07/18/2019  ? COVID-19 04/17/2019  ? History of postpartum hemorrhage 04/14/2019  ? Depression with anxiety 02/16/2017  ? ? ?Past Surgical History:  ?Procedure Laterality Date  ? CYSTOSCOPY WITH URETEROSCOPY, STONE BASKETRY AND STENT PLACEMENT Right 10/06/2014  ? Procedure: CYSTOSCOPY WITH URETEROSCOPY, STONE BASKETRY AND STENT PLACEMENT, RETROGRADE;  Surgeon: Cleon Gustin, MD;  Location: [facility];  Service: Urology;  Laterality: Right;  ? TONSILLECTOMY    ? ? ?OB History   ? ? Gravida  ?2  ? Para  ?2  ? Term  ?2  ? Preterm  ?   ? AB  ?   ? Living  ?2  ?  ? ? SAB  ?   ? IAB  ?   ? Ectopic  ?   ? Multiple  ?0  ? Live Births  ?2  ?   ?  ?  ? ? ? ?Home Medications   ? ?Prior to Admission medications   ?Medication Sig Start Date End Date Taking? Authorizing Provider  ?cyclobenzaprine (FLEXERIL) 5 MG tablet Take 1 tablet (5 mg total) by mouth 3 (three) times daily as needed for muscle spasms. Do not drink alcohol or drive while taking this medication.  May cause drowsiness. 05/15/21  Yes Volney American, PA-C  ?ibuprofen (ADVIL) 600 MG tablet Take 1 tablet (600 mg total) by mouth every 8 (eight) hours as needed for mild  pain, moderate pain or cramping. 05/15/21   Volney American, PA-C  ?Prenatal Vit-Fe Fumarate-FA (PREPLUS) 27-1 MG TABS Take 1 tablet by mouth daily. 12/01/18   Chancy Milroy, MD  ? ? ?Family History ?Family History  ?Problem Relation Age of Onset  ? Bipolar disorder Mother   ? Heart murmur Mother   ? Alcohol abuse Mother   ? Cancer Mother   ?     breast  ? Hypertension Father   ? Hypertension Paternal Grandfather   ? Arthritis Neg Hx   ? ? ?Social History ?Social History  ? ?Tobacco Use  ? Smoking status: Former  ?  Types: Cigarettes  ?  Quit date: 06/13/2015  ?  Years since quitting: 5.9  ? Smokeless tobacco: Never  ?Vaping Use  ? Vaping Use: Never used  ?Substance Use Topics  ? Alcohol use: No  ?  Alcohol/week: 0.0 standard drinks  ? Drug use: No  ? ? ? ?Allergies   ?Patient has no known allergies. ? ? ?Review of Systems ?Review of Systems ?Per HPI ? ?Physical Exam ?Triage Vital Signs ?ED Triage Vitals  ?Enc Vitals  Group  ?   BP 05/15/21 1023 133/82  ?   Pulse Rate 05/15/21 1023 87  ?   Resp 05/15/21 1023 20  ?   Temp 05/15/21 1023 98.7 ?F (37.1 ?C)  ?   Temp Source 05/15/21 1023 Oral  ?   SpO2 05/15/21 1023 99 %  ?   Weight --   ?   Height --   ?   Head Circumference --   ?   Peak Flow --   ?   Pain Score 05/15/21 1024 9  ?   Pain Loc --   ?   Pain Edu? --   ?   Excl. in [place]? --   ? ?No data found. ? ?Updated Vital Signs ?BP 133/82 (BP Location: Right Arm)   Pulse 87   Temp 98.7 ?F (37.1 ?C) (Oral)   Resp 20   SpO2 99%   Breastfeeding No  ? ?Visual Acuity ?Right Eye Distance:   ?Left Eye Distance:   ?Bilateral Distance:   ? ?Right Eye Near:   ?Left Eye Near:    ?Bilateral Near:    ? ?Physical Exam ?Vitals and nursing note reviewed.  ?Constitutional:   ?   Appearance: Normal appearance. She is not ill-appearing.  ?HENT:  ?   Head: Atraumatic.  ?Eyes:  ?   Extraocular Movements: Extraocular movements intact.  ?   Conjunctiva/sclera: Conjunctivae normal.  ?Cardiovascular:  ?   Rate and Rhythm: Normal rate  and regular rhythm.  ?   Heart sounds: Normal heart sounds.  ?Pulmonary:  ?   Effort: Pulmonary effort is normal.  ?   Breath sounds: Normal breath sounds.  ?Musculoskeletal:     ?   General: Signs of injury present. No swelling, tenderness or deformity. Normal range of motion.  ?   Cervical back: Normal range of motion and neck supple.  ?   Comments: Passive range of motion intact of the left knee, pain with active flexion and extension.  No tenderness to palpation diffusely of the left knee.  No deformities palpable.  No swelling noted.  Antalgic gait ?Negative McMurray's and drawer testing  ?Skin: ?   General: Skin is warm and dry.  ?   Findings: No bruising or erythema.  ?Neurological:  ?   Mental Status: She is alert and oriented to person, place, and time.  ?   Motor: No weakness.  ?   Comments: Left lower extremity neurovascularly intact  ?Psychiatric:     ?   Mood and Affect: Mood normal.     ?   Thought Content: Thought content normal.     ?   Judgment: Judgment normal.  ? ? ?UC Treatments / Results  ?Labs ?(all labs ordered are listed, but only abnormal results are displayed) ?Labs Reviewed - No data to display ? ?EKG ? ? ?Radiology ?DG Knee Complete 4 Views Left ? ?Result Date: 05/15/2021 ?CLINICAL DATA:  Acute left knee pain after fall. EXAM: LEFT KNEE - COMPLETE 4+ VIEW COMPARISON:  None. FINDINGS: No evidence of fracture, dislocation, or joint effusion. No evidence of arthropathy or other focal bone abnormality. Soft tissues are unremarkable. IMPRESSION: Negative. Electronically Signed   By: Marijo Conception M.D.   On: 05/15/2021 10:45   ? ?Procedures ?Procedures (including critical care time) ? ?Medications Ordered in UC ?Medications - No data to display ? ?Initial Impression / Assessment and Plan / UC Course  ?I have reviewed the triage vital signs and the nursing notes. ? ?  Pertinent labs & imaging results that were available during my care of the patient were reviewed by me and considered in my medical  decision making (see chart for details). ? ?  ? ?X-ray negative for acute bony abnormality, exam overall reassuring.  Suspect soft tissue contusion causing symptoms.  Treat with ibuprofen, Flexeril, knee sleeve for support.  RICE protocol reviewed.  Work note given.  sports med follow-up for evaluation if symptoms not resolving. ? ?Final Clinical Impressions(s) / UC Diagnoses  ? ?Final diagnoses:  ?Contusion of left knee, initial encounter  ? ?Discharge Instructions   ?None ?  ? ?ED Prescriptions   ? ? Medication Sig Dispense Auth. Provider  ? ibuprofen (ADVIL) 600 MG tablet Take 1 tablet (600 mg total) by mouth every 8 (eight) hours as needed for mild pain, moderate pain or cramping. 30 tablet Volney American, [place]  ? cyclobenzaprine (FLEXERIL) 5 MG tablet Take 1 tablet (5 mg total) by mouth 3 (three) times daily as needed for muscle spasms. Do not drink alcohol or drive while taking this medication.  May cause drowsiness. 15 tablet Volney American, [place]  ? ?  ? ?PDMP not reviewed this encounter. ?  ?Volney American, PA-C ?05/15/21 1201 ? ?

## 2021-12-26 ENCOUNTER — Ambulatory Visit (INDEPENDENT_AMBULATORY_CARE_PROVIDER_SITE_OTHER): Payer: Medicaid Other | Admitting: Obstetrics & Gynecology

## 2021-12-26 ENCOUNTER — Encounter: Payer: Self-pay | Admitting: Adult Health

## 2021-12-26 ENCOUNTER — Ambulatory Visit: Payer: Medicaid Other | Admitting: Adult Health

## 2021-12-26 VITALS — BP 106/72 | HR 108 | Ht 63.0 in | Wt 111.0 lb

## 2021-12-26 DIAGNOSIS — N750 Cyst of Bartholin's gland: Secondary | ICD-10-CM

## 2021-12-26 MED ORDER — OXYCODONE HCL 5 MG PO TABS: 5.0000 mg | ORAL_TABLET | Freq: Four times a day (QID) | ORAL | 0 refills | Status: AC | PRN

## 2021-12-26 MED ORDER — SULFAMETHOXAZOLE-TRIMETHOPRIM 800-160 MG PO TABS: 1.0000 | ORAL_TABLET | Freq: Two times a day (BID) | ORAL | 0 refills | Status: AC

## 2021-12-26 NOTE — Progress Notes (Signed)
Bartholin Cyst I&D and Word Catheter Placement Enlarged abscess palpated in front of the hymenal ring around 5 or 7 o' clock.  Verbal informed consent was obtained.  Discussed complications and possible outcomes of procedure including recurrence of cyst, scarring leading to infection, bleeding, dyspareunia, distortion of anatomy.  Patient was examined in the dorsal lithotomy position and mass was identified.  The area was prepped with Iodine and draped in a sterile manner. 1% Lidocaine (5 ml) was then used to infiltrate area on top of the cyst, behind the hymenal ring.  A 5 mm incision was made using a sterile scapel. Large amount of bloody purulent drainage was expressed through the incision. A Q-tip was used to break up loculations, which resulted in expression of more bloody purulent drainage.  Samples of the drainage were sent for cultures. The open cyst was then copiously irrigated with normal saline, and a Word catheter was placed. 2 ml of sterile water was used to inflate the catheter balloon.  The end of the catheter was tucked into the vagina.  Patient tolerated the procedure well.  - Bactrim DS bid x 7 days for treatment - Recommended Sitz baths bid and Motrin and oxycodone was given  prn pain.   She was told to call to be examined if she experiences increasing swelling, pain, vaginal discharge, or fever.   - She was instructed to wear a peripad to absorb discharge, and to maintain pelvic rest while the Word catheter is in place.  -Follow up in 4wks  Janyth Pupa, DO Attending [place], [facility] for [employer], [place]

## 2021-12-26 NOTE — Progress Notes (Signed)
  Subjective:     Patient ID: Sara [name], female   DOB: 06/30/1995, 26 y.o.   MRN: [id number]  HPI Shelonda is a 26 year old white female,single, G2P2 in complaining of pain and swelling of bartholin cyst started Monday and has gotten bigger.  Last pap was negative HPV and Malignancy 01/20/2019.  Review of Systems Pain and selling of right bartholin gland cyst. Reviewed past medical,surgical, social and family history. Reviewed medications and allergies.     Objective:   Physical Exam BP 106/72 (BP Location: Left Arm, Patient Position: Sitting, Cuff Size: Normal)   Pulse (!) 108   Ht 5\' 3"  (1.6 m)   Wt 111 lb (50.3 kg)   BMI 19.66 kg/m     Skin warm and dry.Pelvic: external genitalia is normal in appearance, has several angiokeratomas left vulva, has swelling and redness right labia, has swollen bartholin gland and is tender. Dr Nelda Marseille in for co exam.  See Dr Annie Main note for I&D and WORD catheter placement.  Fall risk is moderate  [facility] - 12/26/21 1110       Pregnancy Intention Screening   Does the patient want to become pregnant in the next year? No    Does the patient's partner want to become pregnant in the next year? No    Would the patient like to discuss contraceptive options today? No      Contraception Wrap Up   Current Method IUD or IUS    End Method IUD or IUS            Examination chaperoned by Levy Pupa LPN.  Assessment:     1. Infected cyst of Bartholin's gland duct I&D by Dr Gwenith Daily her note  Culture sent     Plan:     Follow up in 4-6 weeks for WORD catheter removal and pap and physical

## 2021-12-27 ENCOUNTER — Encounter: Payer: Self-pay | Admitting: Obstetrics & Gynecology

## 2021-12-30 LAB — WOUND CULTURE: Organism ID, Bacteria: NONE SEEN

## 2022-01-08 ENCOUNTER — Encounter: Payer: Self-pay | Admitting: Emergency Medicine

## 2022-01-08 ENCOUNTER — Ambulatory Visit
Admission: EM | Admit: 2022-01-08 | Discharge: 2022-01-08 | Disposition: A | Discharge status: Home / Self Care | Payer: Medicaid Other | Attending: Family Medicine | Admitting: Family Medicine

## 2022-01-08 DIAGNOSIS — J069 Acute upper respiratory infection, unspecified: Secondary | ICD-10-CM | POA: Diagnosis not present

## 2022-01-08 DIAGNOSIS — U071 COVID-19: Secondary | ICD-10-CM | POA: Diagnosis not present

## 2022-01-08 DIAGNOSIS — R112 Nausea with vomiting, unspecified: Secondary | ICD-10-CM

## 2022-01-08 LAB — RESP PANEL BY RT-PCR (FLU A&B, COVID) ARPGX2
Influenza A by PCR: NEGATIVE
Influenza B by PCR: NEGATIVE
SARS Coronavirus 2 by RT PCR: POSITIVE — AB

## 2022-01-08 MED ORDER — PROMETHAZINE-DM 6.25-15 MG/5ML PO SYRP: 5.0000 mL | ORAL_SOLUTION | Freq: Four times a day (QID) | ORAL | 0 refills | Status: DC | PRN

## 2022-01-08 MED ORDER — ONDANSETRON 4 MG PO TBDP: 4.0000 mg | ORAL_TABLET | Freq: Three times a day (TID) | ORAL | 0 refills | Status: DC | PRN

## 2022-01-08 NOTE — ED Provider Notes (Signed)
[facility]    CSN: [id number] Arrival date & time: 01/08/22  0831      History   Chief Complaint No chief complaint on file.   HPI Sara [name] is a 26 y.o. female.   Patient coming today with 1 day history of nasal congestion, runny nose, headache, cough, vomiting, diarrhea, nausea.  Denies fever, chills, chest pain, shortness of breath, abdominal pain.  So far trying Pepto-Bismol and over-the-counter cold medicine with minimal relief.  Tolerating p.o. fairly well.  No known sick contacts or pertinent chronic medical problems per patient.    Past Medical History:  Diagnosis Date   Asthma    Kidney stone    UTI    Patient Active Problem List   Diagnosis Date Noted   Infected cyst of Bartholin's gland duct 12/26/2021   History of gestational diabetes 07/18/2019   COVID-19 04/17/2019   History of postpartum hemorrhage 04/14/2019   Depression with anxiety 02/16/2017    Past Surgical History:  Procedure Laterality Date   CYSTOSCOPY WITH URETEROSCOPY, STONE BASKETRY AND STENT PLACEMENT Right 10/06/2014   Procedure: CYSTOSCOPY WITH URETEROSCOPY, STONE BASKETRY AND STENT PLACEMENT, RETROGRADE;  Surgeon: Cleon Gustin, MD;  Location: [facility];  Service: Urology;  Laterality: Right;   TONSILLECTOMY      OB History     Gravida  2   Para  2   Term  2   Preterm      AB      Living  2      SAB      IAB      Ectopic      Multiple  0   Live Births  2            Home Medications    Prior to Admission medications   Medication Sig Start Date End Date Taking? Authorizing Provider  ondansetron (ZOFRAN-ODT) 4 MG disintegrating tablet Take 1 tablet (4 mg total) by mouth every 8 (eight) hours as needed for nausea or vomiting. 01/08/22  Yes Volney American, PA-C  promethazine-dextromethorphan (PROMETHAZINE-DM) 6.25-15 MG/5ML syrup Take 5 mLs by mouth 4 (four) times daily as needed. 01/08/22  Yes Volney American, PA-C   Acetaminophen (TYLENOL PO) Take by mouth.    [provider]  levonorgestrel (LILETTA, 52 MG,) 20.1 MCG/DAY IUD IUD 1 each by Intrauterine route once.    [provider]    Family History Family History  Problem Relation Age of Onset   Hypertension Paternal Grandfather    Hypertension Father    Bipolar disorder Mother    Heart murmur Mother    Alcohol abuse Mother    Cancer Mother        breast   Arthritis Neg Hx     Social History Social History   Tobacco Use   Smoking status: Former    Types: Cigarettes    Quit date: 06/13/2015    Years since quitting: 6.5   Smokeless tobacco: Never  Vaping Use   Vaping Use: Never used  Substance Use Topics   Alcohol use: No    Alcohol/week: 0.0 standard drinks of alcohol   Drug use: No     Allergies   Patient has no known allergies.   Review of Systems Review of Systems Per HPI  Physical Exam Triage Vital Signs ED Triage Vitals  Enc Vitals Group     BP 01/08/22 0846 128/81     Pulse Rate 01/08/22 0846 (!) 112  Resp 01/08/22 0846 18     Temp 01/08/22 0846 97.8 F (36.6 C)     Temp Source 01/08/22 0846 Oral     SpO2 01/08/22 0846 99 %     Weight --      Height --      Head Circumference --      Peak Flow --      Pain Score 01/08/22 0848 3     Pain Loc --      Pain Edu? --      Excl. in GC? --    No data found.  Updated Vital Signs BP 128/81 (BP Location: Right Arm)   Pulse (!) 112   Temp 97.8 F (36.6 C) (Oral)   Resp 18   SpO2 99%   Visual Acuity Right Eye Distance:   Left Eye Distance:   Bilateral Distance:    Right Eye Near:   Left Eye Near:    Bilateral Near:     Physical Exam Vitals and nursing note reviewed.  Constitutional:      Appearance: Normal appearance.  HENT:     Head: Atraumatic.     Right Ear: Tympanic membrane and external ear normal.     Left Ear: Tympanic membrane and external ear normal.     Nose: Rhinorrhea present.     Mouth/Throat:     Mouth:  Mucous membranes are moist.     Pharynx: Posterior oropharyngeal erythema present.  Eyes:     Extraocular Movements: Extraocular movements intact.     Conjunctiva/sclera: Conjunctivae normal.  Cardiovascular:     Rate and Rhythm: Normal rate and regular rhythm.     Heart sounds: Normal heart sounds.  Pulmonary:     Effort: Pulmonary effort is normal.     Breath sounds: Normal breath sounds. No wheezing or rales.  Abdominal:     General: Bowel sounds are normal. There is no distension.     Palpations: Abdomen is soft.     Tenderness: There is no abdominal tenderness. There is no right CVA tenderness, left CVA tenderness or guarding.  Musculoskeletal:        General: Normal range of motion.     Cervical back: Normal range of motion and neck supple.  Skin:    General: Skin is warm and dry.  Neurological:     Mental Status: She is alert and oriented to person, place, and time.  Psychiatric:        Mood and Affect: Mood normal.        Thought Content: Thought content normal.      UC Treatments / Results  Labs (all labs ordered are listed, but only abnormal results are displayed) Labs Reviewed  RESP PANEL BY RT-PCR (FLU A&B, COVID) [id number]    EKG   Radiology No results found.  Procedures Procedures (including critical care time)  Medications Ordered in UC Medications - No data to display  Initial Impression / Assessment and Plan / UC Course  I have reviewed the triage vital signs and the nursing notes.  Pertinent labs & imaging results that were available during my care of the patient were reviewed by me and considered in my medical decision making (see chart for details).     Mildly tachycardic in triage, otherwise vital signs reassuring.  Exam overall reassuring and suggestive of a viral upper respiratory infection.  Treat symptomatically with Zofran, Phenergan DM and supportive over-the-counter medications and home care.  Push fluids.  Return for worsening symptoms.  Work note given.  Final Clinical Impressions(s) / UC Diagnoses   Final diagnoses:  Viral URI with cough  Nausea and vomiting, unspecified vomiting type   Discharge Instructions   None    ED Prescriptions     Medication Sig Dispense Auth. Provider   ondansetron (ZOFRAN-ODT) 4 MG disintegrating tablet Take 1 tablet (4 mg total) by mouth every 8 (eight) hours as needed for nausea or vomiting. 20 tablet Particia Nearing, [place]   promethazine-dextromethorphan (PROMETHAZINE-DM) 6.25-15 MG/5ML syrup Take 5 mLs by mouth 4 (four) times daily as needed. 100 mL Particia Nearing, [place]      PDMP not reviewed this encounter.   Roosvelt Maser [place], [place] 01/08/22 [phone]

## 2022-01-08 NOTE — ED Triage Notes (Signed)
Nasal congestion, runny nose, headache, vomiting and some diarrhea since yesterday

## 2022-01-23 ENCOUNTER — Other Ambulatory Visit (HOSPITAL_COMMUNITY)
Admission: RE | Admit: 2022-01-23 | Discharge: 2022-01-23 | Disposition: A | Discharge status: Home / Self Care | Payer: 59 | Source: Ambulatory Visit | Attending: Adult Health | Admitting: Adult Health

## 2022-01-23 ENCOUNTER — Encounter: Payer: Self-pay | Admitting: Adult Health

## 2022-01-23 ENCOUNTER — Ambulatory Visit (INDEPENDENT_AMBULATORY_CARE_PROVIDER_SITE_OTHER): Payer: Medicaid Other | Admitting: Adult Health

## 2022-01-23 VITALS — BP 90/62 | HR 67 | Ht 63.0 in | Wt 114.5 lb

## 2022-01-23 DIAGNOSIS — Z01419 Encounter for gynecological examination (general) (routine) without abnormal findings: Secondary | ICD-10-CM

## 2022-01-23 DIAGNOSIS — N750 Cyst of Bartholin's gland: Secondary | ICD-10-CM | POA: Diagnosis not present

## 2022-01-23 DIAGNOSIS — Z975 Presence of (intrauterine) contraceptive device: Secondary | ICD-10-CM | POA: Diagnosis not present

## 2022-01-23 DIAGNOSIS — Z0000 Encounter for general adult medical examination without abnormal findings: Secondary | ICD-10-CM

## 2022-01-23 NOTE — Progress Notes (Signed)
Patient ID: Sara [name], female   DOB: 02/19/1996, 26 y.o.   MRN: [id number] History of Present Illness: Sara [name] is a 26 year old white female,single, G2P2002 in for well woman gyn exam and pap, and removal of WORD catheter, had I&D Bartholin cyst 12/26/21.   Current Medications, Allergies, Past Medical History, Past Surgical History, Family History and Social History were reviewed in [employer] record.     Review of Systems: Patient denies any headaches, hearing loss, fatigue, blurred vision, shortness of breath, chest pain, abdominal pain, problems with bowel movements, urination, or intercourse(not active). No joint pain or mood swings.     Physical Exam:BP 90/62 (BP Location: Left Arm, Patient Position: Sitting, Cuff Size: Normal)   Pulse 67   Ht 5\' 3"  (1.6 m)   Wt 114 lb 8 oz (51.9 kg)   BMI 20.28 kg/m   General:  Well developed, well nourished, no acute distress Skin:  Warm and dry Neck:  Midline trachea, normal thyroid, good ROM, no lymphadenopathy Lungs; Clear to auscultation bilaterally Breast:  No dominant palpable mass, retraction, or nipple discharge Cardiovascular: Regular rate and rhythm Abdomen:  Soft, non tender, no hepatosplenomegaly Pelvic:  External genitalia is normal in appearance, no lesions.  The vagina is normal in appearance.WORD catheter easily removed after fluid removed from catheter balloon, Bartholin Cyst resolved. Urethra has no lesions or masses. The cervix is bulbous. +IUD strings at os, pap with GC/CHL and HR HPV genotyping performed. Uterus is felt to be normal size, shape, and contour.  No adnexal masses or tenderness noted.Bladder is non tender, no masses felt. Rectal: Deferred Extremities/musculoskeletal:  No swelling or varicosities noted, no clubbing or cyanosis Psych:  No mood changes, alert and cooperative,seems happy AA is 0 Fall risk is low    01/23/2022   10:08 AM 01/23/2022    9:58 AM 01/20/2019   11:17 AM   Depression screen PHQ 2/9  Decreased Interest 0 0 0  Down, Depressed, Hopeless 0 0 0  PHQ - 2 Score 0 0 0  Altered sleeping 1 0 1  Tired, decreased energy 1 1 2   Change in appetite 1 1 0  Feeling bad or failure about yourself  0 0 0  Trouble concentrating 0 0 0  Moving slowly or fidgety/restless 0 0 0  Suicidal thoughts 0 0 0  PHQ-9 Score 3 2 3   Difficult doing work/chores  Not difficult at all        01/23/2022   10:01 AM  GAD 7 : Generalized Anxiety Score  Nervous, Anxious, on Edge 1  Control/stop worrying 0  Worry too much - different things 0  Trouble relaxing 0  Restless 0  Easily annoyed or irritable 0  Afraid - awful might happen 1  Total GAD 7 Score 2      Upstream - 01/23/22 0955       Pregnancy Intention Screening   Does the patient want to become pregnant in the next year? No    Does the patient's partner want to become pregnant in the next year? No    Would the patient like to discuss contraceptive options today? No      Contraception Wrap Up   Current Method IUD or IUS    End Method IUD or IUS            Examination chaperoned by LPN  Impression and Plan: 1. Routine general medical examination at a health care facility Pap sent Physical in  1 year  2. Infected cyst of Bartholin's gland duct Resolved, WORD catheter removed   3. IUD (intrauterine device) in place Liletta placed 07/18/19 post placental   4. Encounter for gynecological examination with Papanicolaou smear of cervix Pap sent Pap in 3 years if normal Physical in 1 year

## 2022-01-27 LAB — CYTOLOGY - PAP
Adequacy: ABSENT
Chlamydia: NEGATIVE
Comment: NEGATIVE
Comment: NEGATIVE
Comment: NORMAL
Diagnosis: NEGATIVE
High risk HPV: NEGATIVE
Neisseria Gonorrhea: NEGATIVE

## 2022-02-11 ENCOUNTER — Other Ambulatory Visit: Payer: Self-pay

## 2022-02-11 ENCOUNTER — Ambulatory Visit
Admission: EM | Admit: 2022-02-11 | Discharge: 2022-02-11 | Disposition: A | Discharge status: Home / Self Care | Payer: 59 | Attending: Nurse Practitioner | Admitting: Nurse Practitioner

## 2022-02-11 ENCOUNTER — Encounter: Payer: Self-pay | Admitting: Emergency Medicine

## 2022-02-11 DIAGNOSIS — K0889 Other specified disorders of teeth and supporting structures: Secondary | ICD-10-CM | POA: Diagnosis not present

## 2022-02-11 MED ORDER — IBUPROFEN 800 MG PO TABS: 800.0000 mg | ORAL_TABLET | Freq: Three times a day (TID) | ORAL | 0 refills | Status: DC | PRN

## 2022-02-11 MED ORDER — AMOXICILLIN-POT CLAVULANATE 875-125 MG PO TABS: 1.0000 | ORAL_TABLET | Freq: Two times a day (BID) | ORAL | 0 refills | Status: AC

## 2022-02-11 NOTE — ED Provider Notes (Signed)
[facility]    CSN: [id number] Arrival date & time: 02/11/22  1030      History   Chief Complaint Chief Complaint  Patient presents with   Dental Pain    HPI Sara [name] is a 26 y.o. female.   Patient presents for dental pain ongoing in the right lower jaw for the past week.  She denies drainage, fever, body aches, chills, nausea/vomiting since the pain began.  Reports the area feels swollen and is painful to touch and is a little bit more red than the rest of her mouth.  She has been taking ibuprofen alternating with Tylenol for the pain which helps temporarily.  Reports she called her dentist but they could not get her in until February.  Patient reports that she was treated about a month ago for an abscess with Bactrim.  No other antibiotic use in the past 90 days.  Denies any allergies to antibiotic therapy.    Past Medical History:  Diagnosis Date   Asthma    Kidney stone    UTI    Patient Active Problem List   Diagnosis Date Noted   Routine general medical examination at a health care facility 01/23/2022   IUD (intrauterine device) in place 01/23/2022   Encounter for gynecological examination with Papanicolaou smear of cervix 01/23/2022   Infected cyst of Bartholin's gland duct 12/26/2021   History of gestational diabetes 07/18/2019   COVID-19 04/17/2019   History of postpartum hemorrhage 04/14/2019   Depression with anxiety 02/16/2017    Past Surgical History:  Procedure Laterality Date   CYSTOSCOPY WITH URETEROSCOPY, STONE BASKETRY AND STENT PLACEMENT Right 10/06/2014   Procedure: CYSTOSCOPY WITH URETEROSCOPY, STONE BASKETRY AND STENT PLACEMENT, RETROGRADE;  Surgeon: Malen Gauze, MD;  Location: [facility];  Service: Urology;  Laterality: Right;   TONSILLECTOMY      OB History     Gravida  2   Para  2   Term  2   Preterm      AB      Living  2      SAB      IAB      Ectopic      Multiple  0   Live Births  2             Home Medications    Prior to Admission medications   Medication Sig Start Date End Date Taking? Authorizing Provider  amoxicillin-clavulanate (AUGMENTIN) 875-125 MG tablet Take 1 tablet by mouth 2 (two) times daily for 7 days. 02/11/22 02/18/22 Yes Valentino Nose, NP  ibuprofen (ADVIL) 800 MG tablet Take 1 tablet (800 mg total) by mouth every 8 (eight) hours as needed. Take with food to prevent GI upset 02/11/22  Yes Cathlean Marseilles A, NP  Acetaminophen (TYLENOL PO) Take by mouth.    [provider]  levonorgestrel (LILETTA, 52 MG,) 20.1 MCG/DAY IUD IUD 1 each by Intrauterine route once.    [provider]    Family History Family History  Problem Relation Age of Onset   Hypertension Paternal Grandfather    Hypertension Father    Bipolar disorder Mother    Heart murmur Mother    Alcohol abuse Mother    Cancer Mother        breast   Arthritis Neg Hx     Social History Social History   Tobacco Use   Smoking status: Former    Types: Cigarettes    Quit date: 06/13/2015  Years since quitting: 6.6   Smokeless tobacco: Never  Vaping Use   Vaping Use: Never used  Substance Use Topics   Alcohol use: No    Alcohol/week: 0.0 standard drinks of alcohol   Drug use: No     Allergies   Patient has no known allergies.   Review of Systems Review of Systems Per HPI  Physical Exam Triage Vital Signs ED Triage Vitals [02/11/22 1226]  Enc Vitals Group     BP (!) 145/88     Pulse Rate 67     Resp 20     Temp 98 F (36.7 C)     Temp Source Oral     SpO2 99 %     Weight      Height      Head Circumference      Peak Flow      Pain Score 8     Pain Loc      Pain Edu?      Excl. in GC?    No data found.  Updated Vital Signs BP (!) 145/88 (BP Location: Right Arm)   Pulse 67   Temp 98 F (36.7 C) (Oral)   Resp 20   SpO2 99%   Visual Acuity Right Eye Distance:   Left Eye Distance:   Bilateral Distance:    Right Eye Near:   Left  Eye Near:    Bilateral Near:     Physical Exam Vitals and nursing note reviewed.  Constitutional:      General: She is not in acute distress.    Appearance: Normal appearance. She is not toxic-appearing.  HENT:     Head: Normocephalic and atraumatic.     Right Ear: External ear normal.     Left Ear: External ear normal.     Nose: Nose normal. No congestion or rhinorrhea.     Mouth/Throat:     Mouth: Mucous membranes are moist.     Dentition: Abnormal dentition. Dental tenderness, gingival swelling and dental caries present. No dental abscesses.     Pharynx: Oropharynx is clear. No pharyngeal swelling or uvula swelling.     Tonsils: No tonsillar exudate. 0 on the right. 0 on the left.      Comments: Mild erythema noted in area marked Eyes:     General: No scleral icterus.    Extraocular Movements: Extraocular movements intact.  Pulmonary:     Effort: Pulmonary effort is normal. No respiratory distress.  Musculoskeletal:     Cervical back: Normal range of motion.  Lymphadenopathy:     Cervical: No cervical adenopathy.  Skin:    General: Skin is warm and dry.     Coloration: Skin is not jaundiced or pale.     Findings: No erythema.  Neurological:     Mental Status: She is alert and oriented to person, place, and time.  Psychiatric:        Behavior: Behavior is cooperative.      UC Treatments / Results  Labs (all labs ordered are listed, but only abnormal results are displayed) Labs Reviewed - No data to display  EKG   Radiology No results found.  Procedures Procedures (including critical care time)  Medications Ordered in UC Medications - No data to display  Initial Impression / Assessment and Plan / UC Course  I have reviewed the triage vital signs and the nursing notes.  Pertinent labs & imaging results that were available during my care of the patient were reviewed  by me and considered in my medical decision making (see chart for details).   Patient is  well-appearing, normotensive, afebrile, not tachycardic, not tachypneic, oxygenating well on room air.    Dentalgia Suspect secondary to gingivitis/low suspicion for dental abscess Will cover with Augmentin twice daily for 7 days Continue ibuprofen and Tylenol as needed for pain Other supportive care discussed Recommended follow-up with dentist as soon as able Note given for work today ER and return precautions discussed  The patient was given the opportunity to ask questions.  All questions answered to their satisfaction.  The patient is in agreement to this plan.    Final Clinical Impressions(s) / UC Diagnoses   Final diagnoses:  Dentalgia     Discharge Instructions      I am concerned you have a dental infection.  Take the Augmentin as prescribed to treat this and plan to follow up with a dentist as soon as you are able to.  You can take Ibuprofen 800 mg every 8 hours alternating with Tylenol [phone] mg every 6 hours as needed for pain.    ED Prescriptions     Medication Sig Dispense Auth. Provider   amoxicillin-clavulanate (AUGMENTIN) 875-125 MG tablet Take 1 tablet by mouth 2 (two) times daily for 7 days. 14 tablet Cathlean Marseilles A, NP   ibuprofen (ADVIL) 800 MG tablet Take 1 tablet (800 mg total) by mouth every 8 (eight) hours as needed. Take with food to prevent GI upset 30 tablet Valentino Nose, NP      PDMP not reviewed this encounter.   Valentino Nose, NP 02/11/22 1245

## 2022-02-11 NOTE — Discharge Instructions (Signed)
I am concerned you have a dental infection.  Take the Augmentin as prescribed to treat this and plan to follow up with a dentist as soon as you are able to.  You can take Ibuprofen 800 mg every 8 hours alternating with Tylenol [phone] mg every 6 hours as needed for pain.

## 2022-02-11 NOTE — ED Triage Notes (Signed)
Pt reports right lower dental pain for last several weeks.pt reports has been taking ibuprofen with some relief of pain but reports dentist is unable to see until feb.

## 2022-02-24 ENCOUNTER — Ambulatory Visit
Admission: EM | Admit: 2022-02-24 | Discharge: 2022-02-24 | Disposition: A | Discharge status: Home / Self Care | Payer: 59 | Attending: Family Medicine | Admitting: Family Medicine

## 2022-02-24 DIAGNOSIS — J069 Acute upper respiratory infection, unspecified: Secondary | ICD-10-CM | POA: Diagnosis not present

## 2022-02-24 DIAGNOSIS — J029 Acute pharyngitis, unspecified: Secondary | ICD-10-CM | POA: Diagnosis not present

## 2022-02-24 LAB — POCT RAPID STREP A (OFFICE): Rapid Strep A Screen: NEGATIVE

## 2022-02-24 NOTE — Discharge Instructions (Signed)
You have a viral upper respiratory infection that still needs a little bit of time to start feeling better.   Some things that can make you feel better are: - Increased rest - Increasing fluid with water/sugar free electrolytes - Acetaminophen and ibuprofen as needed for fever/pain - Salt water gargling, chloraseptic spray and throat lozenges for sore throat - OTC guaifenesin (Mucinex) 600 mg twice daily for nasal congestion - Saline sinus flushes or a neti pot - Humidifying the air

## 2022-02-24 NOTE — ED Provider Notes (Signed)
[facility]    CSN: [id number] Arrival date & time: 02/24/22  4627      History   Chief Complaint No chief complaint on file.   HPI Sara [name] is a 26 y.o. female.   Patient presents today for approximately 4 days of congested cough, chest pain after coughing, nasal congestion and runny nose, postnasal drainage, sore throat, decreased appetite, and fatigue.  No known fevers, shortness of breath, chest congestion, headache, ear pain, abdominal pain, nausea/vomiting, diarrhea, or new rash.  Reports both of her sons are sick with similar symptoms.  They are also being seen today.  Has been taking promethazine-dextromethorphan for cough without much benefit.  Patient denies history of chronic lung disease.     Past Medical History:  Diagnosis Date   Asthma    Kidney stone    UTI    Patient Active Problem List   Diagnosis Date Noted   Routine general medical examination at a health care facility 01/23/2022   IUD (intrauterine device) in place 01/23/2022   Encounter for gynecological examination with Papanicolaou smear of cervix 01/23/2022   Infected cyst of Bartholin's gland duct 12/26/2021   History of gestational diabetes 07/18/2019   COVID-19 04/17/2019   History of postpartum hemorrhage 04/14/2019   Depression with anxiety 02/16/2017    Past Surgical History:  Procedure Laterality Date   CYSTOSCOPY WITH URETEROSCOPY, STONE BASKETRY AND STENT PLACEMENT Right 10/06/2014   Procedure: CYSTOSCOPY WITH URETEROSCOPY, STONE BASKETRY AND STENT PLACEMENT, RETROGRADE;  Surgeon: Malen Gauze, MD;  Location: [facility];  Service: Urology;  Laterality: Right;   TONSILLECTOMY      OB History     Gravida  2   Para  2   Term  2   Preterm      AB      Living  2      SAB      IAB      Ectopic      Multiple  0   Live Births  2            Home Medications    Prior to Admission medications   Medication Sig Start Date End Date Taking?  Authorizing Provider  Acetaminophen (TYLENOL PO) Take by mouth.    [provider]  ibuprofen (ADVIL) 800 MG tablet Take 1 tablet (800 mg total) by mouth every 8 (eight) hours as needed. Take with food to prevent GI upset 02/11/22   Valentino Nose, NP  levonorgestrel (LILETTA, 52 MG,) 20.1 MCG/DAY IUD IUD 1 each by Intrauterine route once.    [provider]    Family History Family History  Problem Relation Age of Onset   Hypertension Paternal Grandfather    Hypertension Father    Bipolar disorder Mother    Heart murmur Mother    Alcohol abuse Mother    Cancer Mother        breast   Arthritis Neg Hx     Social History Social History   Tobacco Use   Smoking status: Former    Types: Cigarettes    Quit date: 06/13/2015    Years since quitting: 6.7   Smokeless tobacco: Never  Vaping Use   Vaping Use: Never used  Substance Use Topics   Alcohol use: No    Alcohol/week: 0.0 standard drinks of alcohol   Drug use: No     Allergies   Patient has no known allergies.   Review of Systems Review of  Systems Per HPI  Physical Exam Triage Vital Signs ED Triage Vitals  Enc Vitals Group     BP 02/24/22 1010 120/87     Pulse Rate 02/24/22 1010 95     Resp 02/24/22 1010 20     Temp 02/24/22 1010 97.7 F (36.5 C)     Temp Source 02/24/22 1010 Oral     SpO2 02/24/22 1010 98 %     Weight --      Height --      Head Circumference --      Peak Flow --      Pain Score 02/24/22 1014 0     Pain Loc --      Pain Edu? --      Excl. in GC? --    No data found.  Updated Vital Signs BP 120/87 (BP Location: Right Arm)   Pulse 95   Temp 97.7 F (36.5 C) (Oral)   Resp 20   LMP 12/24/2021 Comment: per patient no mentrual due to iud  SpO2 98%   Visual Acuity Right Eye Distance:   Left Eye Distance:   Bilateral Distance:    Right Eye Near:   Left Eye Near:    Bilateral Near:     Physical Exam Vitals and nursing note reviewed.  Constitutional:       General: She is not in acute distress.    Appearance: Normal appearance. She is not ill-appearing or toxic-appearing.  HENT:     Head: Normocephalic and atraumatic.     Right Ear: Tympanic membrane, ear canal and external ear normal.     Left Ear: Tympanic membrane, ear canal and external ear normal.     Nose: No congestion or rhinorrhea.     Mouth/Throat:     Mouth: Mucous membranes are moist.     Pharynx: Oropharynx is clear. Posterior oropharyngeal erythema present. No oropharyngeal exudate.  Eyes:     General: No scleral icterus.    Extraocular Movements: Extraocular movements intact.  Cardiovascular:     Rate and Rhythm: Normal rate and regular rhythm.  Pulmonary:     Effort: Pulmonary effort is normal. No respiratory distress.     Breath sounds: Normal breath sounds. No wheezing, rhonchi or rales.  Abdominal:     General: Abdomen is flat. Bowel sounds are normal. There is no distension.     Palpations: Abdomen is soft.     Tenderness: There is no abdominal tenderness.  Musculoskeletal:     Cervical back: Normal range of motion and neck supple.  Lymphadenopathy:     Cervical: No cervical adenopathy.  Skin:    General: Skin is warm and dry.     Coloration: Skin is not jaundiced or pale.     Findings: No erythema or rash.  Neurological:     Mental Status: She is alert and oriented to person, place, and time.  Psychiatric:        Behavior: Behavior is cooperative.      UC Treatments / Results  Labs (all labs ordered are listed, but only abnormal results are displayed) Labs Reviewed  CULTURE, GROUP A STREP [facility])  POCT RAPID STREP A (OFFICE)    EKG   Radiology No results found.  Procedures Procedures (including critical care time)  Medications Ordered in UC Medications - No data to display  Initial Impression / Assessment and Plan / UC Course  I have reviewed the triage vital signs and the nursing notes.  Pertinent labs & imaging  results that were  available during my care of the patient were reviewed by me and considered in my medical decision making (see chart for details).   Patient is well-appearing, normotensive, afebrile, not tachycardic, not tachypneic, oxygenating well on room air.    Viral URI with cough Acute pharyngitis, unspecified etiology Rapid strep throat test negative, throat culture pending Suspect viral upper respiratory infection Given length of symptoms, viral testing deferred at this time Supportive care discussed ER and return precautions discussed Note given for work  The patient was given the opportunity to ask questions.  All questions answered to their satisfaction.  The patient is in agreement to this plan.    Final Clinical Impressions(s) / UC Diagnoses   Final diagnoses:  Viral URI with cough  Acute pharyngitis, unspecified etiology     Discharge Instructions      You have a viral upper respiratory infection that still needs a little bit of time to start feeling better.   Some things that can make you feel better are: - Increased rest - Increasing fluid with water/sugar free electrolytes - Acetaminophen and ibuprofen as needed for fever/pain - Salt water gargling, chloraseptic spray and throat lozenges for sore throat - OTC guaifenesin (Mucinex) 600 mg twice daily for nasal congestion - Saline sinus flushes or a neti pot - Humidifying the air       ED Prescriptions   None    PDMP not reviewed this encounter.   Valentino Nose, NP 02/24/22 1118

## 2022-02-24 NOTE — ED Triage Notes (Signed)
Pt  reports with cough, sore throat, and nasal congestion started yesterday. Took promethazine cough syrup but no relief.

## 2022-02-27 LAB — CULTURE, GROUP A STREP (THRC)

## 2022-05-22 IMAGING — DX DG KNEE COMPLETE 4+V*L*
4 series · 4 of 4 positions shown · non-contrast
Comparison: None.

CLINICAL DATA: Acute left knee pain after fall.

EXAM:
LEFT KNEE - COMPLETE 4+ VIEW

[knee ap]
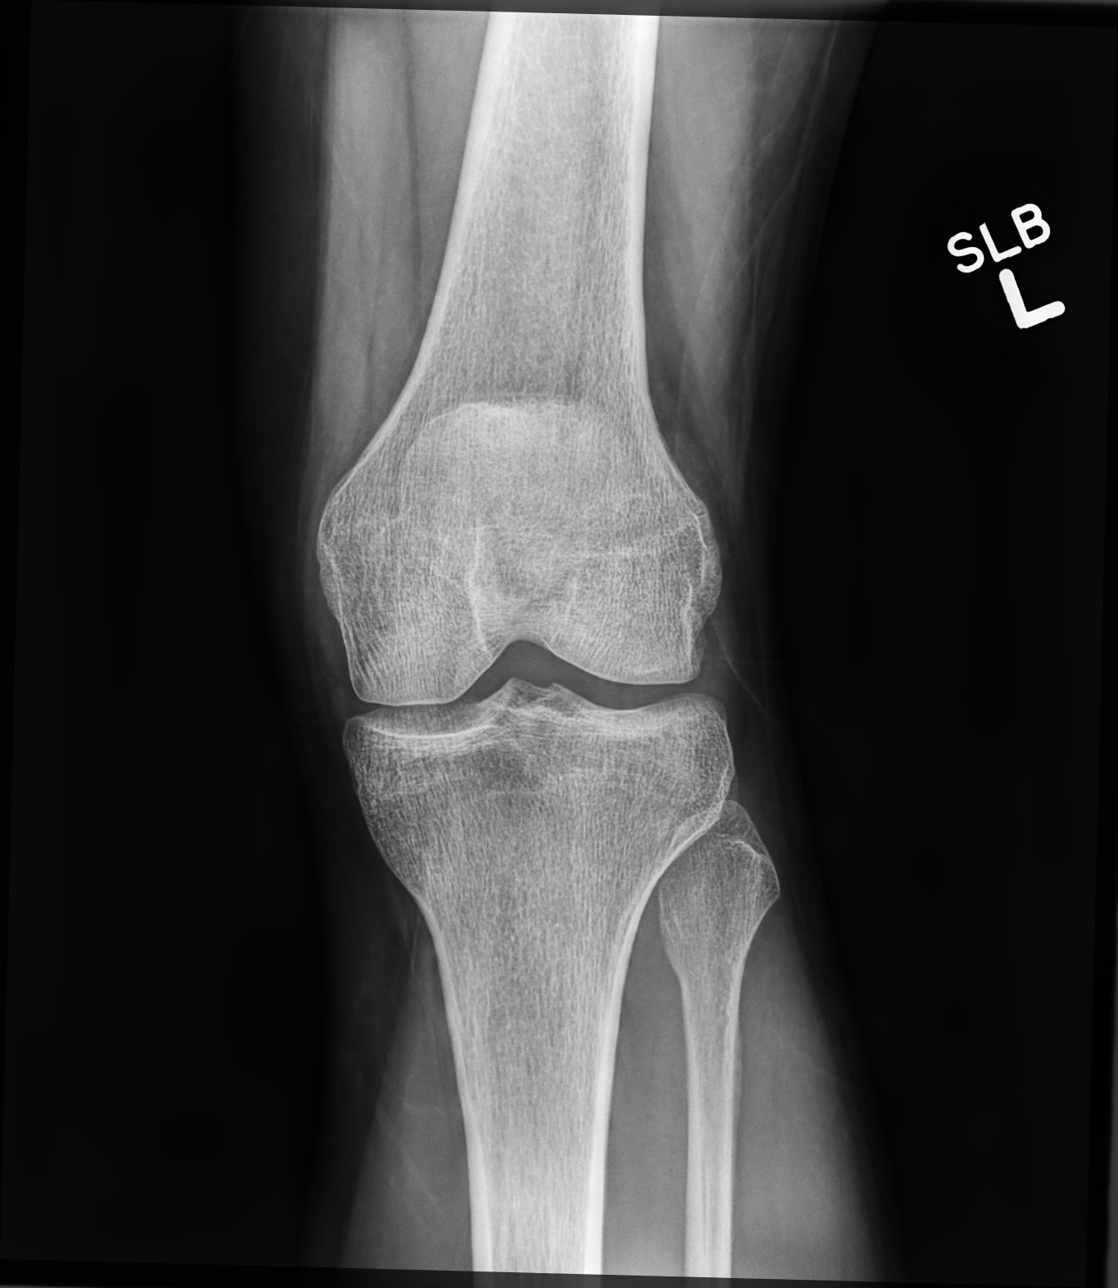

[knee mlo (1 of 2)]
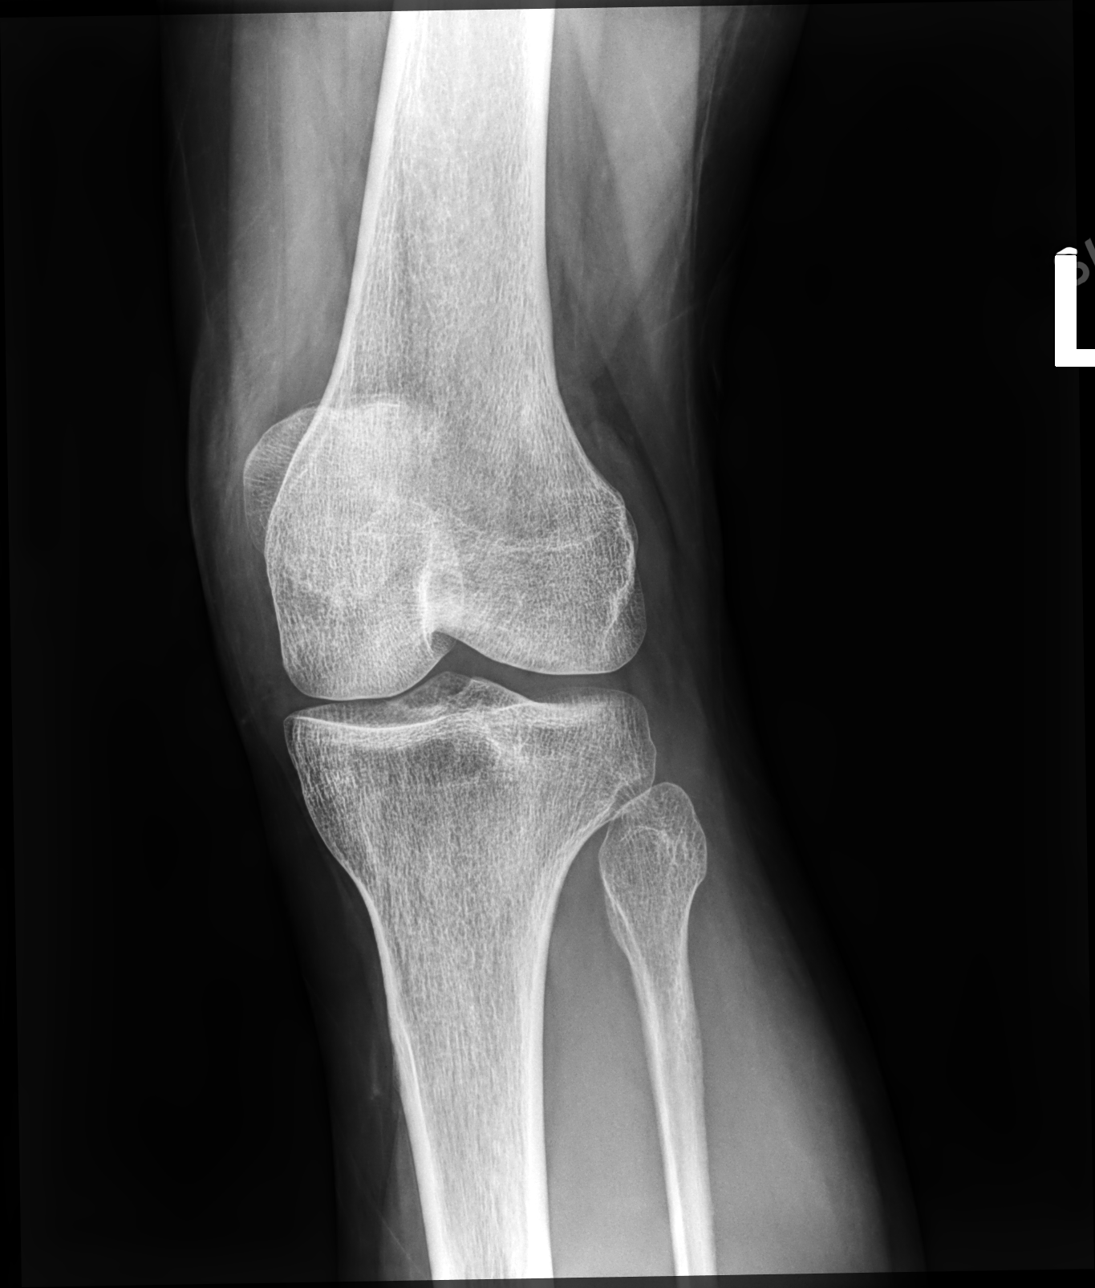

[knee mlo (2 of 2)]
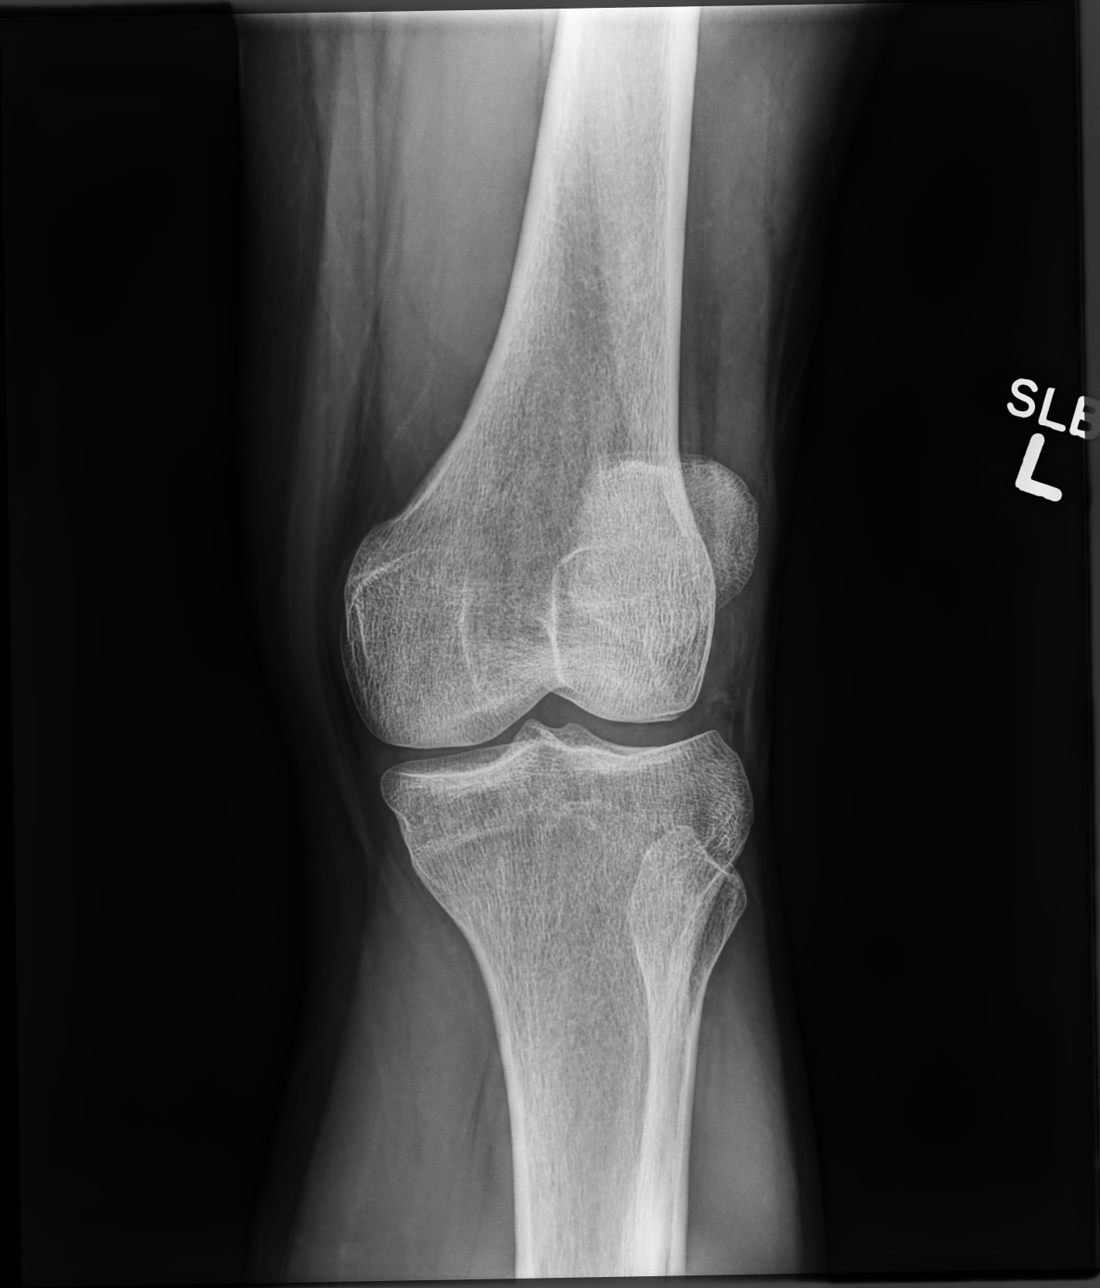

[knee lat]
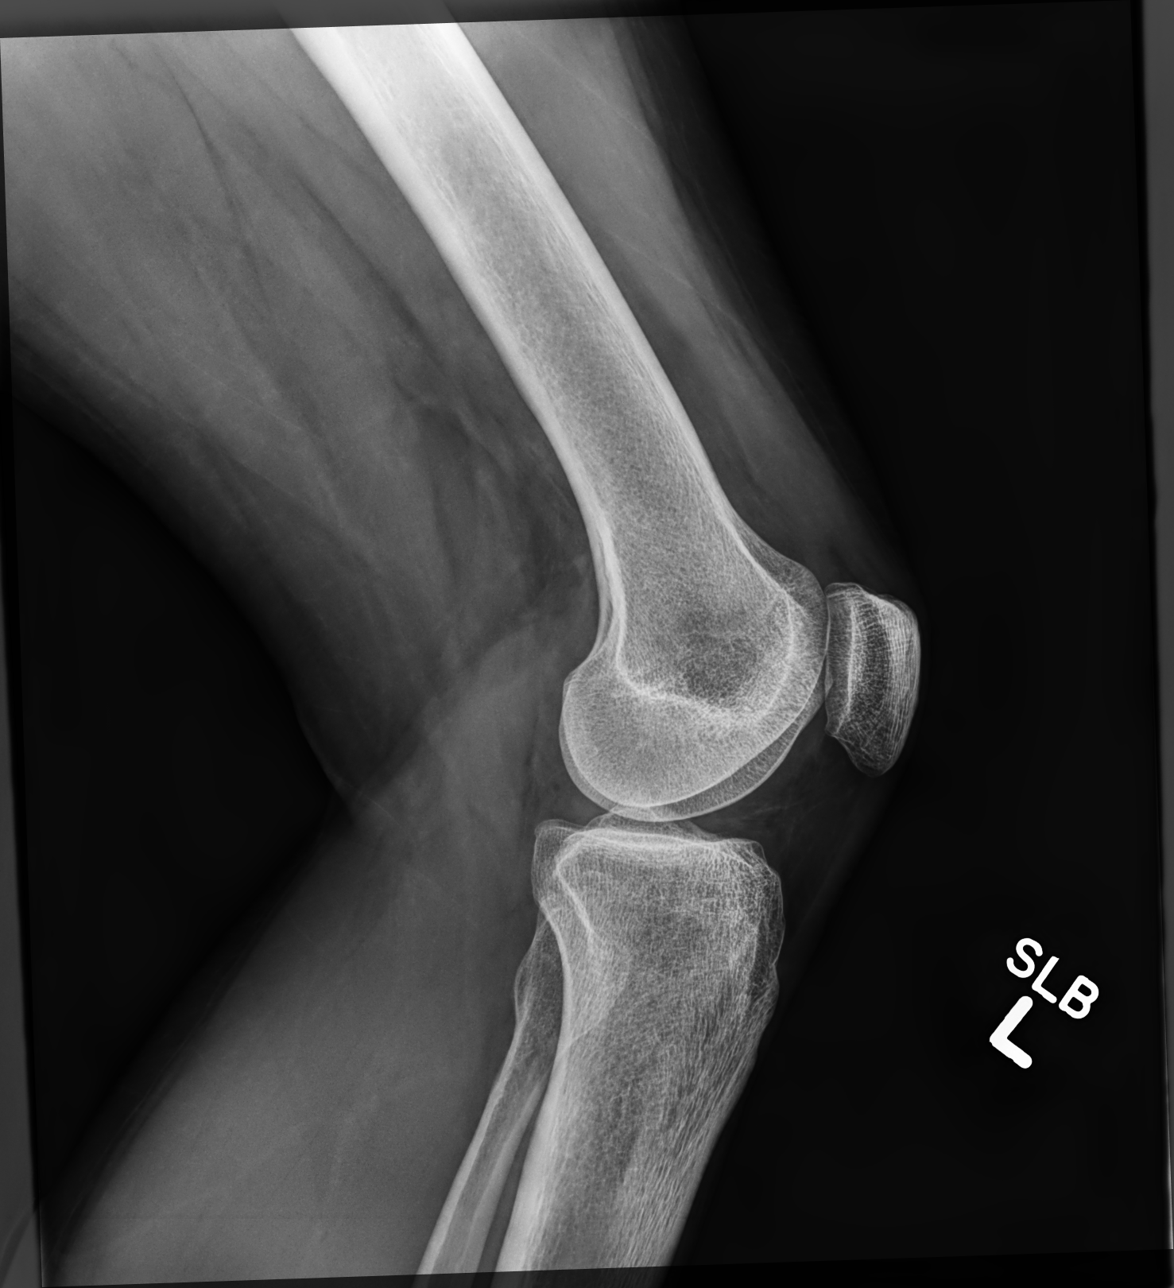

[4 of 4 positions shown; findings below may reference images not displayed]

FINDINGS: No evidence of fracture, dislocation, or joint effusion. No evidence
of arthropathy or other focal bone abnormality. Soft tissues are
unremarkable.
IMPRESSION: Negative.

## 2022-06-22 ENCOUNTER — Encounter: Payer: Self-pay | Admitting: Emergency Medicine

## 2022-06-22 ENCOUNTER — Other Ambulatory Visit: Payer: Self-pay

## 2022-06-22 ENCOUNTER — Emergency Department (HOSPITAL_COMMUNITY)
Admission: EM | Admit: 2022-06-22 | Discharge: 2022-06-22 | Disposition: A | Discharge status: Home / Self Care | Payer: Medicaid Other | Attending: Emergency Medicine | Admitting: Emergency Medicine

## 2022-06-22 ENCOUNTER — Encounter (HOSPITAL_COMMUNITY): Payer: Self-pay

## 2022-06-22 ENCOUNTER — Ambulatory Visit
Admission: EM | Admit: 2022-06-22 | Discharge: 2022-06-22 | Disposition: A | Discharge status: Home / Self Care | Payer: Medicaid Other | Attending: Emergency Medicine | Admitting: Emergency Medicine

## 2022-06-22 DIAGNOSIS — K0889 Other specified disorders of teeth and supporting structures: Secondary | ICD-10-CM | POA: Diagnosis not present

## 2022-06-22 DIAGNOSIS — K029 Dental caries, unspecified: Secondary | ICD-10-CM | POA: Diagnosis not present

## 2022-06-22 MED ORDER — OXYCODONE-ACETAMINOPHEN 5-325 MG PO TABS: 1.0000 | ORAL_TABLET | Freq: Three times a day (TID) | ORAL | 0 refills | Status: DC | PRN

## 2022-06-22 MED ORDER — NYSTATIN 100000 UNIT/ML MT SUSP: 5.0000 mL | Freq: Four times a day (QID) | OROMUCOSAL | 0 refills | Status: AC | PRN

## 2022-06-22 MED ORDER — IBUPROFEN 800 MG PO TABS: 800.0000 mg | ORAL_TABLET | Freq: Three times a day (TID) | ORAL | 0 refills | Status: DC | PRN

## 2022-06-22 MED ORDER — AMOXICILLIN-POT CLAVULANATE 875-125 MG PO TABS: 1.0000 | ORAL_TABLET | Freq: Two times a day (BID) | ORAL | 0 refills | Status: DC

## 2022-06-22 MED ORDER — KETOROLAC TROMETHAMINE 15 MG/ML IJ SOLN
15.0000 mg | Freq: Once | INTRAMUSCULAR | Status: AC
  Administered 2022-06-22: 15 mg via INTRAMUSCULAR
  Filled 2022-06-22: qty 1

## 2022-06-22 MED ORDER — OXYCODONE-ACETAMINOPHEN 5-325 MG PO TABS
1.0000 | ORAL_TABLET | Freq: Once | ORAL | Status: AC
  Administered 2022-06-22: 1 via ORAL
  Filled 2022-06-22: qty 1

## 2022-06-22 NOTE — ED Triage Notes (Signed)
Pt reports recurrent tooth pain on right side of mouth. Pt reports similar in January and reports antibiotics helped. Pt reports has not been able to schedule dental procedure yet.   Tylenol and otc medications has not helped with pain.

## 2022-06-22 NOTE — Discharge Instructions (Addendum)
Today you are being evaluated for dental pain  Begin Augmentin every morning and every evening for 7 days, daily you will see improvement in about 48 hours and steady progression from  May use ibuprofen every 8 hours as needed for comfort, may take this in addition to Tylenol  Gargle and spit Magic mouthwash solution every 4-6 hours as needed to provide temporary relief to your mouth and throat  Please follow-up with dentist if symptoms continue to persist or worsen

## 2022-06-22 NOTE — ED Triage Notes (Signed)
Patient reports she went to urgent care today for possible dental infection, has taken one dose of amoxicillin and pain has gotten worse tonight. Pain is in upper and lower right side in the back of mouth.

## 2022-06-22 NOTE — ED Notes (Signed)
Patient given discharge instructions and follow up care. Patient verbalized understanding. Patient ambulatory out of ED by self. 

## 2022-06-22 NOTE — ED Provider Notes (Signed)
[facility]    CSN: [id number] Arrival date & time: 06/22/22  1354      History   Chief Complaint Chief Complaint  Patient presents with   Dental Pain    HPI Sara [name] is a 27 y.o. female.   Patient presents for evaluation of intermittent dental pain to the right upper and lower jawline for 3 days.  When heightened pain is severe, rated a 10 out of 10.  Able to tolerate food and liquids but has been painful to chew.  Has attempted use of Tylenol which has been minimally effective.  This has been a recurring issue, has been unable to see dentist due to financial strain.  Denies fevers . last antibiotic use in January 2024.  Past Medical History:  Diagnosis Date   Asthma    Kidney stone    UTI    Patient Active Problem List   Diagnosis Date Noted   Routine general medical examination at a health care facility 01/23/2022   IUD (intrauterine device) in place 01/23/2022   Encounter for gynecological examination with Papanicolaou smear of cervix 01/23/2022   Infected cyst of Bartholin's gland duct 12/26/2021   History of gestational diabetes 07/18/2019   COVID-19 04/17/2019   History of postpartum hemorrhage 04/14/2019   Depression with anxiety 02/16/2017    Past Surgical History:  Procedure Laterality Date   CYSTOSCOPY WITH URETEROSCOPY, STONE BASKETRY AND STENT PLACEMENT Right 10/06/2014   Procedure: CYSTOSCOPY WITH URETEROSCOPY, STONE BASKETRY AND STENT PLACEMENT, RETROGRADE;  Surgeon: Malen Gauze, MD;  Location: [facility];  Service: Urology;  Laterality: Right;   TONSILLECTOMY      OB History     Gravida  2   Para  2   Term  2   Preterm      AB      Living  2      SAB      IAB      Ectopic      Multiple  0   Live Births  2            Home Medications    Prior to Admission medications   Medication Sig Start Date End Date Taking? Authorizing Provider  Acetaminophen (TYLENOL PO) Take by mouth.    [provider]   ibuprofen (ADVIL) 800 MG tablet Take 1 tablet (800 mg total) by mouth every 8 (eight) hours as needed. Take with food to prevent GI upset 02/11/22   Valentino Nose, NP  levonorgestrel (LILETTA, 52 MG,) 20.1 MCG/DAY IUD IUD 1 each by Intrauterine route once.    [provider]    Family History Family History  Problem Relation Age of Onset   Hypertension Paternal Grandfather    Hypertension Father    Bipolar disorder Mother    Heart murmur Mother    Alcohol abuse Mother    Cancer Mother        breast   Arthritis Neg Hx     Social History Social History   Tobacco Use   Smoking status: Former    Types: Cigarettes    Quit date: 06/13/2015    Years since quitting: 7.0   Smokeless tobacco: Never  Vaping Use   Vaping Use: Never used  Substance Use Topics   Alcohol use: No    Alcohol/week: 0.0 standard drinks of alcohol   Drug use: No     Allergies   Patient has no known allergies.   Review of Systems  Review of Systems Defer to HPI    Physical Exam Triage Vital Signs ED Triage Vitals  Enc Vitals Group     BP 06/22/22 1404 126/82     Pulse Rate 06/22/22 1404 82     Resp 06/22/22 1404 20     Temp 06/22/22 1404 98.1 F (36.7 C)     Temp Source 06/22/22 1404 Oral     SpO2 06/22/22 1404 98 %     Weight --      Height --      Head Circumference --      Peak Flow --      Pain Score 06/22/22 1403 10     Pain Loc --      Pain Edu? --      Excl. in GC? --    No data found.  Updated Vital Signs BP 126/82 (BP Location: Right Arm)   Pulse 82   Temp 98.1 F (36.7 C) (Oral)   Resp 20   SpO2 98%   Visual Acuity Right Eye Distance:   Left Eye Distance:   Bilateral Distance:    Right Eye Near:   Left Eye Near:    Bilateral Near:     Physical Exam Constitutional:      Appearance: Normal appearance.  HENT:     Mouth/Throat:     Comments: Dental decay to the tip of the right upper and lower gumline, mild to moderate gingival swelling, less  than 0.5 cm abscess present to the right lower gumline Eyes:     Extraocular Movements: Extraocular movements intact.  Pulmonary:     Effort: Pulmonary effort is normal.  Neurological:     Mental Status: She is alert and oriented to person, place, and time. Mental status is at baseline.      UC Treatments / Results  Labs (all labs ordered are listed, but only abnormal results are displayed) Labs Reviewed - No data to display  EKG   Radiology No results found.  Procedures Procedures (including critical care time)  Medications Ordered in UC Medications - No data to display  Initial Impression / Assessment and Plan / UC Course  I have reviewed the triage vital signs and the nursing notes.  Pertinent labs & imaging results that were available during my care of the patient were reviewed by me and considered in my medical decision making (see chart for details).  Dental pain  Will provide coverage for infection as abscesses present along the right lower gumline, Augmentin prescribed as well as ibuprofen 800 mg and Magic mouthwash for additional comfort, advised follow-up with dentist but may follow-up with urgent care if unable to be seen Final Clinical Impressions(s) / UC Diagnoses   Final diagnoses:  Pain, dental     Discharge Instructions      Today you are being evaluated for dental pain  Begin Augmentin every morning and every evening for 7 days, daily you will see improvement in about 48 hours and steady progression from  May use ibuprofen every 8 hours as needed for comfort, may take this in addition to Tylenol  Gargle and spit Magic mouthwash solution every 4-6 hours as needed to provide temporary relief to your mouth and throat  Please follow-up with dentist if symptoms continue to persist or worsen   ED Prescriptions   None    PDMP not reviewed this encounter.   Valinda Hoar, NP 06/22/22 1416

## 2022-06-22 NOTE — ED Provider Notes (Signed)
[place] EMERGENCY DEPARTMENT AT [facility] Provider Note   CSN: [id number] Arrival date & time: 06/22/22  2153     History  Chief Complaint  Patient presents with   Dental Pain    Sara [name] is a 27 y.o. female.   Dental Pain Patient presents with dental pain.  Has had for the last few days.  Worse on the right side.  However has had somewhat chronic dental pain.  Seen at urgent care today and given Augmentin.  States pain uncontrolled by the Tylenol she was on.  No fevers.  No difficulty swallowing.  States she has some bad teeth.  Had not been able to get to see a dentist due to financial issues.     Home Medications Prior to Admission medications   Medication Sig Start Date End Date Taking? Authorizing Provider  amoxicillin-clavulanate (AUGMENTIN) 875-125 MG tablet Take 1 tablet by mouth every 12 (twelve) hours. 06/22/22  Yes White, Adrienne R, NP  ibuprofen (ADVIL) 800 MG tablet Take 1 tablet (800 mg total) by mouth every 8 (eight) hours as needed. Take with food to prevent GI upset Patient taking differently: Take 800 mg by mouth every 8 (eight) hours as needed (for pain- Take with food to prevent GI upset). 06/22/22  Yes White, Adrienne R, NP  levonorgestrel (LILETTA, 52 MG,) 20.1 MCG/DAY IUD IUD 1 each by Intrauterine route once.   Yes [provider]  ORAJEL 3X TOOTHACHE & GUM 20-0.26-0.15 % GEL Place 1 application  onto teeth every 2 (two) hours as needed (for pain).   Yes [provider]  oxyCODONE-acetaminophen (PERCOCET/ROXICET) 5-325 MG tablet Take 1 tablet by mouth every 8 (eight) hours as needed for severe pain. 06/22/22  Yes Benjiman Core, MD  TYLENOL 500 MG tablet Take 500-1,000 mg by mouth every 6 (six) hours as needed (for pain).   Yes [provider]  magic mouthwash (nystatin, lidocaine, diphenhydrAMINE, alum & mag hydroxide) suspension Swish and spit 5 mLs 4 (four) times daily as needed for mouth pain. Patient not  taking: Reported on 06/22/2022 06/22/22   Valinda Hoar, NP      Allergies    Patient has no known allergies.    Review of Systems   Review of Systems  Physical Exam Updated Vital Signs BP 119/71 (BP Location: Right Arm)   Pulse 68   Temp 98.4 F (36.9 C) (Oral)   Resp 18   Ht 5\' 3"  (1.6 m)   Wt 59 kg   SpO2 100%   BMI 23.03 kg/m  Physical Exam Vitals and nursing note reviewed.  HENT:     Mouth/Throat:     Comments: Somewhat poor dentition.  No swelling but some tenderness to right upper and lower jaw. Cardiovascular:     Rate and Rhythm: Regular rhythm.  Neurological:     Mental Status: She is alert.     ED Results / Procedures / Treatments   Labs (all labs ordered are listed, but only abnormal results are displayed) Labs Reviewed - No data to display  EKG None  Radiology No results found.  Procedures Procedures    Medications Ordered in ED Medications  oxyCODONE-acetaminophen (PERCOCET/ROXICET) 5-325 MG per tablet 1 tablet (1 tablet Oral Given 06/22/22 2236)  ketorolac (TORADOL) 15 MG/ML injection 15 mg (15 mg Intramuscular Given 06/22/22 2236)    ED Course/ Medical Decision Making/ A&P  Medical Decision Making Risk Prescription drug management.   Patient with dental pain.  Already on antibiotics but continued pain.  Will give shot of Toradol and a Percocet here.  Will give 2 Percocets for home due to pain.  I have reviewed drug database.  No recent prescriptions.  Will need dental follow-up.  No large abscess seen.  Doubt severe infection such as Ludwig's angina.  Appears stable for discharge home.        Final Clinical Impression(s) / ED Diagnoses Final diagnoses:  Pain due to dental caries    Rx / DC Orders ED Discharge Orders          Ordered    oxyCODONE-acetaminophen (PERCOCET/ROXICET) 5-325 MG tablet  Every 8 hours PRN        06/22/22 2233              Benjiman Core, MD 06/22/22 2240

## 2022-06-23 ENCOUNTER — Ambulatory Visit: Payer: 59

## 2022-07-30 ENCOUNTER — Ambulatory Visit
Admission: RE | Admit: 2022-07-30 | Discharge: 2022-07-30 | Disposition: A | Discharge status: Home / Self Care | Payer: Medicaid Other | Source: Ambulatory Visit | Attending: Nurse Practitioner | Admitting: Nurse Practitioner

## 2022-07-30 ENCOUNTER — Other Ambulatory Visit: Payer: Self-pay

## 2022-07-30 VITALS — BP 122/83 | HR 77 | Temp 98.3°F | Resp 20

## 2022-07-30 DIAGNOSIS — R197 Diarrhea, unspecified: Secondary | ICD-10-CM

## 2022-07-30 DIAGNOSIS — R110 Nausea: Secondary | ICD-10-CM | POA: Diagnosis not present

## 2022-07-30 LAB — POCT URINE PREGNANCY: Preg Test, Ur: NEGATIVE

## 2022-07-30 MED ORDER — ONDANSETRON 4 MG PO TBDP: 4.0000 mg | ORAL_TABLET | Freq: Three times a day (TID) | ORAL | 0 refills | Status: DC | PRN

## 2022-07-30 NOTE — ED Triage Notes (Signed)
Pt reports abd pain and diarrhea with eating that started this am. Pt also reports is seeing OB-GYN tomorrow to follow-up regarding vaginal bleeding and possible IUD "moved." and unsure if related.

## 2022-07-30 NOTE — ED Provider Notes (Signed)
[facility]    CSN: [id number] Arrival date & time: 07/30/22  1120      History   Chief Complaint Chief Complaint  Patient presents with   Diarrhea    Entered by patient    HPI ARNIKA BYERLEY is a 27 y.o. female.   Patient presents today with 1 day history of lower abdominal pain and diarrhea.  Reports it began when she woke up this morning.  She also endorses nausea, however no vomiting.  She reports 3 episodes of nonbloody, some formed pieces of brown diarrhea today.  No vomiting, fever, or chills.  She does endorse body aches and decreased appetite.  She has been able to drink water and not throw up today.  No recent foreign travel, recent dietary history, similar illness and known contacts, or recent antibiotic use.  Patient reports that she has been having menstrual bleeding for the past 1.5 to 2 weeks.  She has an IUD and is planning to have that checked tomorrow with OB/GYN and thinks it may have moved.    Past Medical History:  Diagnosis Date   Asthma    Kidney stone    UTI    Patient Active Problem List   Diagnosis Date Noted   Routine general medical examination at a health care facility 01/23/2022   IUD (intrauterine device) in place 01/23/2022   Encounter for gynecological examination with Papanicolaou smear of cervix 01/23/2022   Infected cyst of Bartholin's gland duct 12/26/2021   History of gestational diabetes 07/18/2019   COVID-19 04/17/2019   History of postpartum hemorrhage 04/14/2019   Depression with anxiety 02/16/2017    Past Surgical History:  Procedure Laterality Date   CYSTOSCOPY WITH URETEROSCOPY, STONE BASKETRY AND STENT PLACEMENT Right 10/06/2014   Procedure: CYSTOSCOPY WITH URETEROSCOPY, STONE BASKETRY AND STENT PLACEMENT, RETROGRADE;  Surgeon: Malen Gauze, MD;  Location: [facility];  Service: Urology;  Laterality: Right;   TONSILLECTOMY      OB History     Gravida  2   Para  2   Term  2   Preterm      AB       Living  2      SAB      IAB      Ectopic      Multiple  0   Live Births  2            Home Medications    Prior to Admission medications   Medication Sig Start Date End Date Taking? Authorizing Provider  ondansetron (ZOFRAN-ODT) 4 MG disintegrating tablet Take 1 tablet (4 mg total) by mouth every 8 (eight) hours as needed for nausea or vomiting. 07/30/22  Yes Cathlean Marseilles A, NP  ibuprofen (ADVIL) 800 MG tablet Take 1 tablet (800 mg total) by mouth every 8 (eight) hours as needed. Take with food to prevent GI upset Patient taking differently: Take 800 mg by mouth every 8 (eight) hours as needed (for pain- Take with food to prevent GI upset). 06/22/22   White, Elita Boone, NP  levonorgestrel (LILETTA, 52 MG,) 20.1 MCG/DAY IUD IUD 1 each by Intrauterine route once.    [provider]  magic mouthwash (nystatin, lidocaine, diphenhydrAMINE, alum & mag hydroxide) suspension Swish and spit 5 mLs 4 (four) times daily as needed for mouth pain. Patient not taking: Reported on 06/22/2022 06/22/22   Valinda Hoar, NP  TYLENOL 500 MG tablet Take 500-1,000 mg by mouth every 6 (six) hours  as needed (for pain).    [provider]    Family History Family History  Problem Relation Age of Onset   Hypertension Paternal Grandfather    Hypertension Father    Bipolar disorder Mother    Heart murmur Mother    Alcohol abuse Mother    Cancer Mother        breast   Arthritis Neg Hx     Social History Social History   Tobacco Use   Smoking status: Former    Types: Cigarettes    Quit date: 06/13/2015    Years since quitting: 7.1   Smokeless tobacco: Never  Vaping Use   Vaping Use: Never used  Substance Use Topics   Alcohol use: No    Alcohol/week: 0.0 standard drinks of alcohol   Drug use: No     Allergies   Patient has no known allergies.   Review of Systems Review of Systems Per HPI  Physical Exam Triage Vital Signs ED Triage Vitals [07/30/22 1131]   Enc Vitals Group     BP 122/83     Pulse Rate 77     Resp 20     Temp 98.3 F (36.8 C)     Temp Source Oral     SpO2 98 %     Weight      Height      Head Circumference      Peak Flow      Pain Score 3     Pain Loc      Pain Edu?      Excl. in GC?    No data found.  Updated Vital Signs BP 122/83 (BP Location: Right Arm)   Pulse 77   Temp 98.3 F (36.8 C) (Oral)   Resp 20   SpO2 98%   Visual Acuity Right Eye Distance:   Left Eye Distance:   Bilateral Distance:    Right Eye Near:   Left Eye Near:    Bilateral Near:     Physical Exam Vitals and nursing note reviewed.  Constitutional:      General: She is not in acute distress.    Appearance: Normal appearance. She is not toxic-appearing.  HENT:     Head: Normocephalic and atraumatic.     Mouth/Throat:     Mouth: Mucous membranes are moist.     Pharynx: Oropharynx is clear.  Eyes:     General: No scleral icterus.    Extraocular Movements: Extraocular movements intact.  Cardiovascular:     Rate and Rhythm: Normal rate and regular rhythm.  Pulmonary:     Effort: Pulmonary effort is normal. No respiratory distress.     Breath sounds: Normal breath sounds. No wheezing, rhonchi or rales.  Abdominal:     General: Abdomen is flat. Bowel sounds are normal. There is no distension.     Palpations: Abdomen is soft.     Tenderness: There is no abdominal tenderness. There is no right CVA tenderness, left CVA tenderness or guarding.  Skin:    General: Skin is warm and dry.     Capillary Refill: Capillary refill takes less than 2 seconds.     Coloration: Skin is not jaundiced or pale.     Findings: No erythema.  Neurological:     Mental Status: She is alert and oriented to person, place, and time.  Psychiatric:        Behavior: Behavior is cooperative.      UC Treatments / Results  Labs (all labs ordered are listed, but only abnormal results are displayed) Labs Reviewed  POCT URINE PREGNANCY     EKG   Radiology No results found.  Procedures Procedures (including critical care time)  Medications Ordered in UC Medications - No data to display  Initial Impression / Assessment and Plan / UC Course  I have reviewed the triage vital signs and the nursing notes.  Pertinent labs & imaging results that were available during my care of the patient were reviewed by me and considered in my medical decision making (see chart for details).   Patient is well-appearing, normotensive, afebrile, not tachycardic, not tachypneic, oxygenating well on room air.    1. Nausea without vomiting 2. Diarrhea, unspecified type Suspect acute viral gastroenteritis Vital signs and exam today are reassuring-no guarding on abdominal exam Will treat nausea with Zofran 4 mg ODT every 8 hours as needed for nausea/vomiting UPT today is negative Supportive care discussed with patient ER return precautions discussed Note given for work  The patient was given the opportunity to ask questions.  All questions answered to their satisfaction.  The patient is in agreement to this plan.    Final Clinical Impressions(s) / UC Diagnoses   Final diagnoses:  Nausea without vomiting  Diarrhea, unspecified type     Discharge Instructions      As we discussed, I suspect you may have a stomach bug.  Take the nausea medicine as needed and push hydration and home.  If the pain becomes severe or if you starting lots of vomiting and are unable to keep fluids down or blood in your stool, please go to the ER.    ED Prescriptions     Medication Sig Dispense Auth. Provider   ondansetron (ZOFRAN-ODT) 4 MG disintegrating tablet Take 1 tablet (4 mg total) by mouth every 8 (eight) hours as needed for nausea or vomiting. 20 tablet Valentino Nose, NP      PDMP not reviewed this encounter.   Valentino Nose, NP 07/30/22 1224

## 2022-07-30 NOTE — Discharge Instructions (Signed)
As we discussed, I suspect you may have a stomach bug.  Take the nausea medicine as needed and push hydration and home.  If the pain becomes severe or if you starting lots of vomiting and are unable to keep fluids down or blood in your stool, please go to the ER.

## 2022-07-31 ENCOUNTER — Ambulatory Visit: Payer: Medicaid Other | Admitting: Adult Health

## 2022-08-01 ENCOUNTER — Encounter: Payer: Self-pay | Admitting: Adult Health

## 2022-08-01 ENCOUNTER — Ambulatory Visit: Payer: Medicaid Other | Admitting: Adult Health

## 2022-08-01 VITALS — BP 108/70 | HR 83 | Ht 63.0 in | Wt 116.5 lb

## 2022-08-01 DIAGNOSIS — R102 Pelvic and perineal pain unspecified side: Secondary | ICD-10-CM | POA: Insufficient documentation

## 2022-08-01 DIAGNOSIS — N926 Irregular menstruation, unspecified: Secondary | ICD-10-CM

## 2022-08-01 DIAGNOSIS — Z975 Presence of (intrauterine) contraceptive device: Secondary | ICD-10-CM

## 2022-08-01 NOTE — Progress Notes (Signed)
  Subjective:     Patient ID: Sara [name], female   DOB: 1995/07/29, 27 y.o.   MRN: [id number]  HPI Sara [name] is a 27 year old white female,single, G2P2002, in wanting IUD checked, she usually just has some spotting but had bleeding and cramps for 2 weeks, has stopped now.     Component Value Date/Time   DIAGPAP  01/23/2022 0958    - Negative for intraepithelial lesion or malignancy (NILM)   DIAGPAP  01/20/2019 1114    - Negative for intraepithelial lesion or malignancy (NILM)   HPVHIGH Negative 01/23/2022 0958   HPVHIGH Negative 01/20/2019 1114   ADEQPAP  01/23/2022 0958    Satisfactory for evaluation; transformation zone component ABSENT.   ADEQPAP  01/20/2019 1114    Satisfactory for evaluation; transformation zone component PRESENT.    Review of Systems Bleeding and cramping for about 2 weeks, has stopped now Reviewed past medical,surgical, social and family history. Reviewed medications and allergies.     Objective:   Physical Exam BP 108/70 (BP Location: Left Arm, Patient Position: Sitting, Cuff Size: Normal)   Pulse 83   Ht 5\' 3"  (1.6 m)   Wt 116 lb 8 oz (52.8 kg)   LMP 07/18/2022 (Approximate)   BMI 20.64 kg/m  Skin warm and dry.Pelvic: external genitalia is normal in appearance no lesions, vagina: has creamy discharge no odor,urethra has no lesions or masses noted, cervix:everted at os, +IUD strings seen, they are short, uterus: normal size, shape and contour, non tender, no masses felt, adnexa: no masses or tenderness noted. Bladder is non tender and no masses felt.   Upstream - 08/01/22 1149       Pregnancy Intention Screening   Does the patient want to become pregnant in the next year? No    Does the patient's partner want to become pregnant in the next year? No    Would the patient like to discuss contraceptive options today? No      Contraception Wrap Up   Current Method IUD or IUS    End Method IUD or IUS            Examination chaperoned by Malachy Mood  LPN     Assessment:     1. Irregular bleeding Usually has some spotting but has bleeding and cramps for about 2 weeks, has stopped now Call if bleeding returns, could try megace  2. IUD (intrauterine device) in place Liletta placed 07/18/19  3. Pelvic cramping     Plan:     Follow up prn

## 2023-07-05 ENCOUNTER — Encounter: Payer: Self-pay | Admitting: Emergency Medicine

## 2023-07-05 ENCOUNTER — Other Ambulatory Visit: Payer: Self-pay

## 2023-07-05 ENCOUNTER — Ambulatory Visit
Admission: EM | Admit: 2023-07-05 | Discharge: 2023-07-05 | Disposition: A | Discharge status: Home / Self Care | Attending: Family Medicine | Admitting: Family Medicine

## 2023-07-05 DIAGNOSIS — R509 Fever, unspecified: Secondary | ICD-10-CM | POA: Diagnosis not present

## 2023-07-05 DIAGNOSIS — N390 Urinary tract infection, site not specified: Secondary | ICD-10-CM | POA: Insufficient documentation

## 2023-07-05 LAB — POCT URINALYSIS DIP (MANUAL ENTRY)
Bilirubin, UA: NEGATIVE
Glucose, UA: NEGATIVE mg/dL
Nitrite, UA: POSITIVE — AB
Protein Ur, POC: >=300 mg/dL — AB
Spec Grav, UA: 1.015 (ref 1.010–1.025)
Urobilinogen, UA: 0.2 U/dL
pH, UA: 6 (ref 5.0–8.0)

## 2023-07-05 MED ORDER — CEPHALEXIN 500 MG PO CAPS: 500.0000 mg | ORAL_CAPSULE | Freq: Two times a day (BID) | ORAL | 0 refills | Status: DC

## 2023-07-05 NOTE — ED Provider Notes (Addendum)
 [facility]    CSN: [id number] Arrival date & time: 07/05/23  1139      History   Chief Complaint Chief Complaint  Patient presents with   Back Pain    HPI Sara [name] is a 28 y.o. female.   Presenting today with several day history of lower back aching, headache, nausea, vomiting, fatigue, fever.  Denies cough, congestion, sore throat, chest pain, shortness of breath, bowel changes.  So far not trying anything over-the-counter for symptoms.  History of UTIs.    Past Medical History:  Diagnosis Date   Asthma    Kidney stone    UTI    Patient Active Problem List   Diagnosis Date Noted   Pelvic cramping 08/01/2022   Irregular bleeding 08/01/2022   Routine general medical examination at a health care facility 01/23/2022   IUD (intrauterine device) in place 01/23/2022   Encounter for gynecological examination with Papanicolaou smear of cervix 01/23/2022   Infected cyst of Bartholin's gland duct 12/26/2021   History of gestational diabetes 07/18/2019   COVID-19 04/17/2019   History of postpartum hemorrhage 04/14/2019   Depression with anxiety 02/16/2017    Past Surgical History:  Procedure Laterality Date   CYSTOSCOPY WITH URETEROSCOPY, STONE BASKETRY AND STENT PLACEMENT Right 10/06/2014   Procedure: CYSTOSCOPY WITH URETEROSCOPY, STONE BASKETRY AND STENT PLACEMENT, RETROGRADE;  Surgeon: Marco Severs, MD;  Location: [facility];  Service: Urology;  Laterality: Right;   TONSILLECTOMY      OB History     Gravida  2   Para  2   Term  2   Preterm      AB      Living  2      SAB      IAB      Ectopic      Multiple  0   Live Births  2            Home Medications    Prior to Admission medications   Medication Sig Start Date End Date Taking? Authorizing Provider  cephALEXin  (KEFLEX ) 500 MG capsule Take 1 capsule (500 mg total) by mouth 2 (two) times daily. 07/05/23  Yes Corbin Dess, PA-C  levonorgestrel  (LILETTA , 52  MG,) 20.1 MCG/DAY IUD IUD 1 each by Intrauterine route once.    [provider]  magic mouthwash (nystatin , lidocaine , diphenhydrAMINE , alum & mag hydroxide) suspension Swish and spit 5 mLs 4 (four) times daily as needed for mouth pain. 06/22/22   Reena Canning, NP    Family History Family History  Problem Relation Age of Onset   Hypertension Paternal Grandfather    Hypertension Father    Bipolar disorder Mother    Heart murmur Mother    Alcohol abuse Mother    Cancer Mother        breast   Arthritis Neg Hx     Social History Social History   Tobacco Use   Smoking status: Former    Current packs/day: 0.00    Types: Cigarettes    Quit date: 06/13/2015    Years since quitting: 8.0   Smokeless tobacco: Never  Vaping Use   Vaping status: Never Used  Substance Use Topics   Alcohol use: No    Alcohol/week: 0.0 standard drinks of alcohol   Drug use: No     Allergies   Patient has no known allergies.   Review of Systems Review of Systems Per HPI  Physical Exam Triage Vital Signs ED  Triage Vitals  Encounter Vitals Group     BP 07/05/23 1218 115/80     Systolic BP Percentile --      Diastolic BP Percentile --      Pulse Rate 07/05/23 1218 94     Resp 07/05/23 1218 20     Temp 07/05/23 1218 100.3 F (37.9 C)     Temp Source 07/05/23 1218 Oral     SpO2 07/05/23 1218 99 %     Weight --      Height --      Head Circumference --      Peak Flow --      Pain Score 07/05/23 1216 8     Pain Loc --      Pain Education --      Exclude from Growth Chart --    No data found.  Updated Vital Signs BP 115/80 (BP Location: Right Arm)   Pulse 94   Temp 100.3 F (37.9 C) (Oral)   Resp 20   SpO2 99%   Visual Acuity Right Eye Distance:   Left Eye Distance:   Bilateral Distance:    Right Eye Near:   Left Eye Near:    Bilateral Near:     Physical Exam Vitals and nursing note reviewed.  Constitutional:      Appearance: Normal appearance. She is not  ill-appearing.  HENT:     Head: Atraumatic.     Mouth/Throat:     Mouth: Mucous membranes are moist.  Eyes:     Extraocular Movements: Extraocular movements intact.     Conjunctiva/sclera: Conjunctivae normal.  Cardiovascular:     Rate and Rhythm: Normal rate and regular rhythm.     Heart sounds: Normal heart sounds.  Pulmonary:     Effort: Pulmonary effort is normal.     Breath sounds: Normal breath sounds.  Abdominal:     General: Bowel sounds are normal. There is no distension.     Palpations: Abdomen is soft.     Tenderness: There is no abdominal tenderness. There is no right CVA tenderness, left CVA tenderness or guarding.  Musculoskeletal:        General: Normal range of motion.     Cervical back: Normal range of motion and neck supple.  Skin:    General: Skin is warm and dry.  Neurological:     Mental Status: She is alert and oriented to person, place, and time.  Psychiatric:        Mood and Affect: Mood normal.        Thought Content: Thought content normal.        Judgment: Judgment normal.      UC Treatments / Results  Labs (all labs ordered are listed, but only abnormal results are displayed) Labs Reviewed  POCT URINALYSIS DIP (MANUAL ENTRY) - Abnormal; Notable for the following components:      Result Value   Clarity, UA cloudy (*)    Ketones, POC UA moderate (40) (*)    Blood, UA moderate (*)    Protein Ur, POC >=300 (*)    Nitrite, UA Positive (*)    Leukocytes, UA Small (1+) (*)    All other components within normal limits  URINE CULTURE    EKG   Radiology No results found.  Procedures Procedures (including critical care time)  Medications Ordered in UC Medications - No data to display  Initial Impression / Assessment and Plan / UC Course  I have reviewed the triage  vital signs and the nursing notes.  Pertinent labs & imaging results that were available during my care of the patient were reviewed by me and considered in my medical  decision making (see chart for details).     Febrile in triage, otherwise vital signs reassuring.  She is well-appearing and in no acute distress.  Urinalysis with evidence of urinary tract infection, possibly starting to progress upward given worsening symptoms.  Urine culture pending, treat with Keflex , fluids, over-the-counter pain and fever relievers as needed.  Return for worsening symptoms. Final Clinical Impressions(s) / UC Diagnoses   Final diagnoses:  Acute lower UTI  Fever, unspecified   Discharge Instructions   None    ED Prescriptions     Medication Sig Dispense Auth. Provider   cephALEXin  (KEFLEX ) 500 MG capsule Take 1 capsule (500 mg total) by mouth 2 (two) times daily. 10 capsule Corbin Dess, [place]      PDMP not reviewed this encounter.   Corbin Dess, [place] 07/05/23 1251    Tacy Expose [place], [place] 07/05/23 1254

## 2023-07-05 NOTE — ED Triage Notes (Addendum)
 Pt reports lower back pain, headache,emesis, and nausea since Thursday that is worse with position changes.

## 2023-07-07 LAB — URINE CULTURE: Culture: >=100000 — AB

## 2023-07-08 ENCOUNTER — Telehealth (HOSPITAL_COMMUNITY): Payer: Self-pay

## 2023-07-08 MED ORDER — NITROFURANTOIN MONOHYD MACRO 100 MG PO CAPS: 100.0000 mg | ORAL_CAPSULE | Freq: Two times a day (BID) | ORAL | 0 refills | Status: AC

## 2023-07-08 NOTE — Telephone Encounter (Signed)
 Pt called stating last night she began experiencing rash throughout body. She began treatment with Keflex  on Sunday evening.  She states she called an after-hours nurse line last night and was told to apply a topical medication. She states this AM she woke up with swelling to her eyes. Denies any swelling to lips, tongue, or throat. No difficulty swallowing, no SOB. Also denies any other new meds, foods, or products.  Pt advised to stop Keflex  and take PO Benadryl  if she has not done so already. ER precautions advised. Verbalized understanding.  Pt did have positive urine culture. Please advise regarding treatment. Thank you
